# Patient Record
Sex: Male | Born: 1950
Health system: Southern US, Community
[De-identification: ages and names within clinical notes are randomized; demographics above are authoritative.]

## PROBLEM LIST (undated history)

## (undated) DIAGNOSIS — I2510 Atherosclerotic heart disease of native coronary artery without angina pectoris: Secondary | ICD-10-CM

## (undated) DIAGNOSIS — C140 Malignant neoplasm of pharynx, unspecified: Secondary | ICD-10-CM

## (undated) DIAGNOSIS — D571 Sickle-cell disease without crisis: Secondary | ICD-10-CM

## (undated) DIAGNOSIS — N400 Enlarged prostate without lower urinary tract symptoms: Secondary | ICD-10-CM

## (undated) DIAGNOSIS — H3322 Serous retinal detachment, left eye: Secondary | ICD-10-CM

## (undated) DIAGNOSIS — D696 Thrombocytopenia, unspecified: Secondary | ICD-10-CM

## (undated) DIAGNOSIS — E785 Hyperlipidemia, unspecified: Secondary | ICD-10-CM

## (undated) DIAGNOSIS — F329 Major depressive disorder, single episode, unspecified: Secondary | ICD-10-CM

## (undated) DIAGNOSIS — IMO0002 Reserved for concepts with insufficient information to code with codable children: Secondary | ICD-10-CM

## (undated) DIAGNOSIS — C801 Malignant (primary) neoplasm, unspecified: Secondary | ICD-10-CM

## (undated) DIAGNOSIS — F32A Depression, unspecified: Secondary | ICD-10-CM

## (undated) DIAGNOSIS — R569 Unspecified convulsions: Secondary | ICD-10-CM

## (undated) DIAGNOSIS — J309 Allergic rhinitis, unspecified: Secondary | ICD-10-CM

## (undated) HISTORY — DX: Serous retinal detachment, left eye: H33.22

## (undated) HISTORY — DX: Malignant (primary) neoplasm, unspecified: C80.1

## (undated) HISTORY — DX: Atherosclerotic heart disease of native coronary artery without angina pectoris: I25.10

## (undated) HISTORY — DX: Unspecified convulsions: R56.9

## (undated) HISTORY — DX: Major depressive disorder, single episode, unspecified: F32.9

## (undated) HISTORY — DX: Thrombocytopenia, unspecified: D69.6

## (undated) HISTORY — DX: Benign prostatic hyperplasia without lower urinary tract symptoms: N40.0

## (undated) HISTORY — DX: Reserved for concepts with insufficient information to code with codable children: IMO0002

## (undated) HISTORY — DX: Sickle-cell disease without crisis: D57.1

## (undated) HISTORY — DX: Depression, unspecified: F32.A

## (undated) HISTORY — PX: PILONIDAL CYST EXCISION: SHX744

## (undated) HISTORY — DX: Malignant neoplasm of pharynx, unspecified: C14.0

## (undated) HISTORY — DX: Allergic rhinitis, unspecified: J30.9

## (undated) HISTORY — DX: Hyperlipidemia, unspecified: E78.5

---

## 2003-10-10 ENCOUNTER — Inpatient Hospital Stay (HOSPITAL_COMMUNITY): Admission: EM | Admit: 2003-10-10 | Discharge: 2003-10-16 | Payer: Self-pay | Admitting: Emergency Medicine

## 2004-10-24 ENCOUNTER — Ambulatory Visit: Payer: Self-pay | Admitting: *Deleted

## 2004-11-14 ENCOUNTER — Ambulatory Visit: Payer: Self-pay | Admitting: Family Medicine

## 2005-01-20 ENCOUNTER — Ambulatory Visit: Payer: Self-pay | Admitting: Family Medicine

## 2005-04-03 ENCOUNTER — Ambulatory Visit (HOSPITAL_COMMUNITY): Admission: RE | Admit: 2005-04-03 | Discharge: 2005-04-03 | Payer: Self-pay | Admitting: Otolaryngology

## 2005-04-03 ENCOUNTER — Encounter (INDEPENDENT_AMBULATORY_CARE_PROVIDER_SITE_OTHER): Payer: Self-pay | Admitting: *Deleted

## 2005-04-18 ENCOUNTER — Ambulatory Visit (HOSPITAL_COMMUNITY): Admission: RE | Admit: 2005-04-18 | Discharge: 2005-04-18 | Payer: Self-pay | Admitting: Colon and Rectal Surgery

## 2005-04-28 ENCOUNTER — Ambulatory Visit (HOSPITAL_COMMUNITY): Admission: RE | Admit: 2005-04-28 | Discharge: 2005-04-28 | Payer: Self-pay | Admitting: Radiation Oncology

## 2005-04-28 ENCOUNTER — Ambulatory Visit: Payer: Self-pay | Admitting: Family Medicine

## 2005-04-30 ENCOUNTER — Ambulatory Visit: Payer: Self-pay | Admitting: Dentistry

## 2005-04-30 ENCOUNTER — Encounter: Admission: AD | Admit: 2005-04-30 | Discharge: 2005-04-30 | Payer: Self-pay | Admitting: Dentistry

## 2005-05-28 ENCOUNTER — Ambulatory Visit: Admission: RE | Admit: 2005-05-28 | Discharge: 2005-08-08 | Payer: Self-pay | Admitting: Radiation Oncology

## 2005-06-03 ENCOUNTER — Ambulatory Visit: Payer: Self-pay | Admitting: Dentistry

## 2005-07-02 ENCOUNTER — Ambulatory Visit: Payer: Self-pay | Admitting: Dentistry

## 2005-08-11 ENCOUNTER — Ambulatory Visit: Payer: Self-pay | Admitting: Family Medicine

## 2005-08-19 ENCOUNTER — Ambulatory Visit: Payer: Self-pay | Admitting: Dentistry

## 2005-09-08 ENCOUNTER — Ambulatory Visit: Payer: Self-pay | Admitting: Family Medicine

## 2005-09-09 ENCOUNTER — Ambulatory Visit: Payer: Self-pay | Admitting: Family Medicine

## 2005-11-19 ENCOUNTER — Ambulatory Visit: Payer: Self-pay | Admitting: Family Medicine

## 2005-12-09 ENCOUNTER — Ambulatory Visit: Payer: Self-pay | Admitting: Dentistry

## 2005-12-10 ENCOUNTER — Ambulatory Visit: Payer: Self-pay | Admitting: Family Medicine

## 2005-12-31 ENCOUNTER — Ambulatory Visit: Payer: Self-pay | Admitting: Family Medicine

## 2006-01-19 ENCOUNTER — Ambulatory Visit: Payer: Self-pay | Admitting: Dentistry

## 2006-02-10 ENCOUNTER — Ambulatory Visit: Payer: Self-pay | Admitting: Family Medicine

## 2006-02-18 ENCOUNTER — Ambulatory Visit: Payer: Self-pay | Admitting: Dentistry

## 2006-03-29 IMAGING — PT NM PET TUM IMG SKULL BASE T - THIGH
4 series · 25 of 25 positions shown · non-contrast
Comparison: Patient had a biopsy of the epiglottis on 04/03/05 with diagnosis of squamous cell carcinoma.   CT scan on 04/18/05 without evidence of cervical adenopathy.

CLINICAL DATA: History of squamous cell carcinoma of the epiglottis.
 FDG PET-CT TUMOR IMAGING (SKULL BASE TO THIGHS):
 Fasting Blood Glucose:  104.
TECHNIQUE: 17.8 mCi F-18 FDG was injected intravenously via the left antecubital fossa.  Full-ring PET imaging was performed from the skull base through the mid-thighs 62 minutes after injection.  CT data was obtained and used for attenuation correction and anatomic localization only.  (This was not acquired as a diagnostic CT examination).

[Series 1: pet ac · axial · 3.3mm · 4.69mm/px · z∈[-870,+0]mm · 8 of 267 slices shown]
[im 1/267]
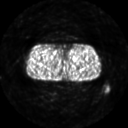
[im 39/267]
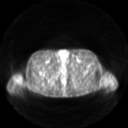
[im 77/267]
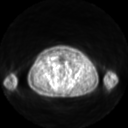
[im 115/267]
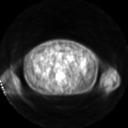
[im 153/267]
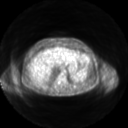
[im 191/267]
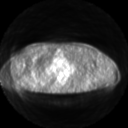
[im 229/267]
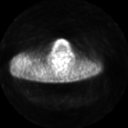
[im 267/267]
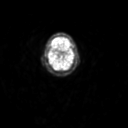

[Series 2: pet nac · axial · 3.3mm · 4.69mm/px · z∈[-870,+0]mm · 8 of 267 slices shown]
[im 1/267]
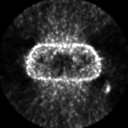
[im 39/267]
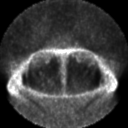
[im 77/267]
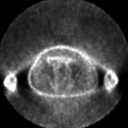
[im 115/267]
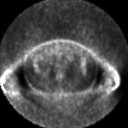
[im 153/267]
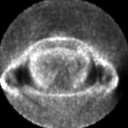
[im 191/267]
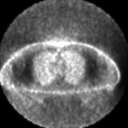
[im 229/267]
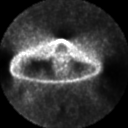
[im 267/267]
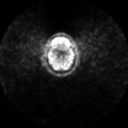

[Series 2: ct images · axial · 3.8mm · 0.98mm/px · z∈[-870,+0]mm · 8 of 267 slices shown]
[im 1/267]
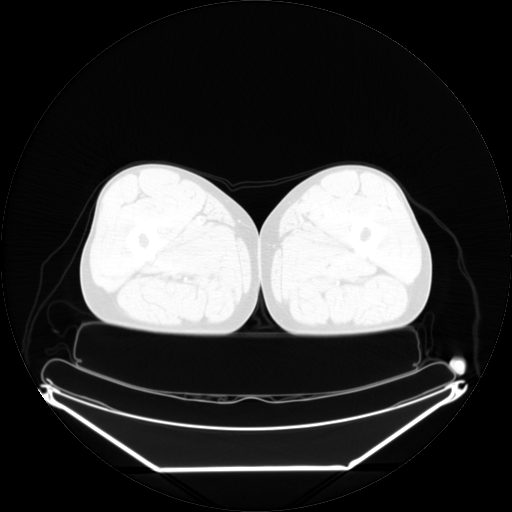
[im 39/267]
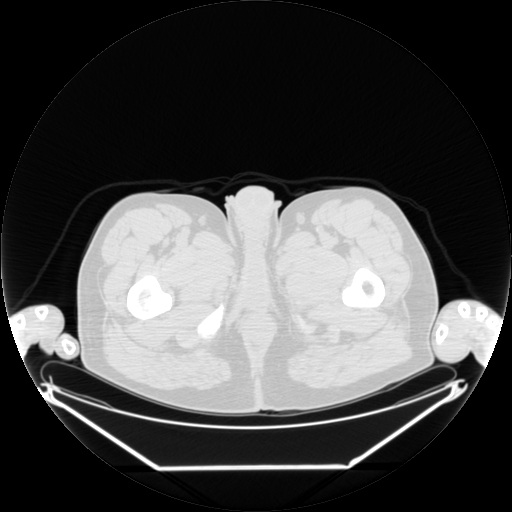
[im 77/267]
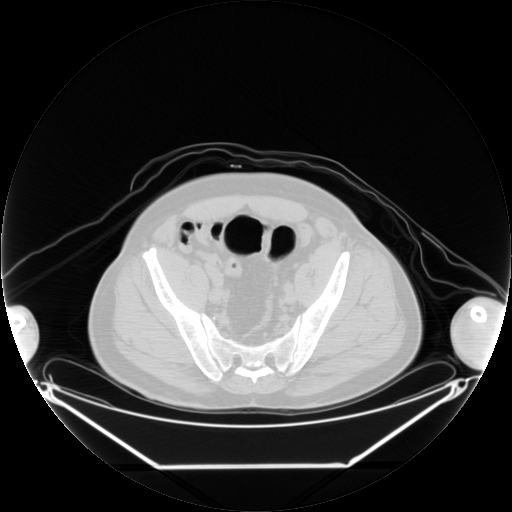
[im 115/267]
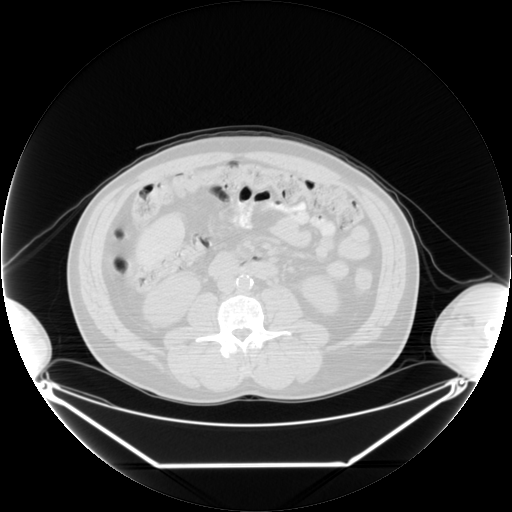
[im 153/267]
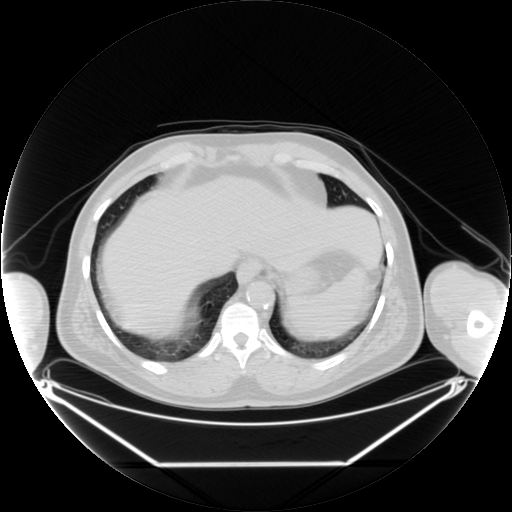
[im 191/267]
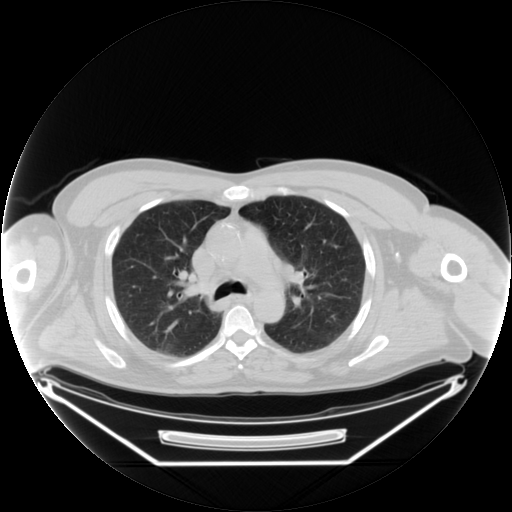
[im 229/267]
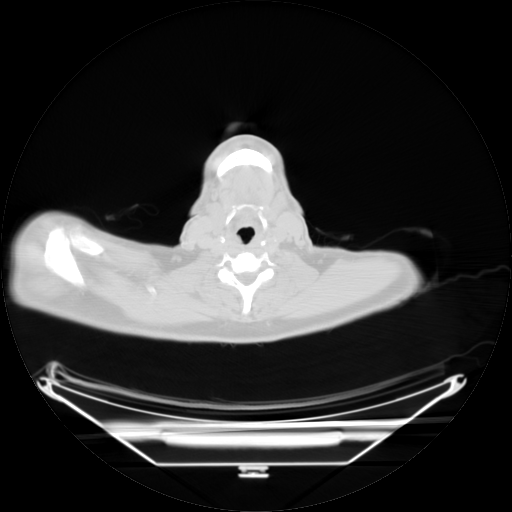
[im 267/267  brain]
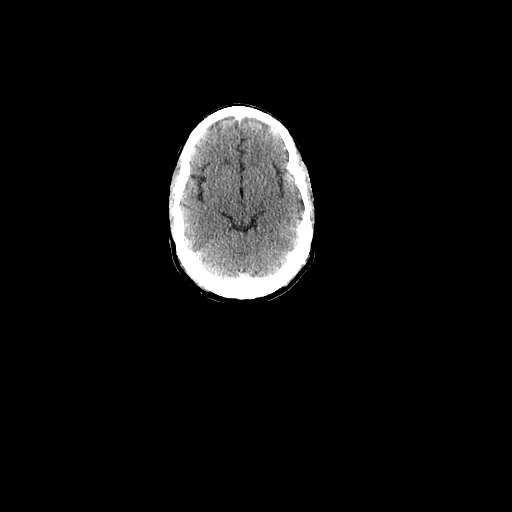

[Series 123: mip · coronal · 3.3mm · 4.69mm/px · 1 of 30 slices shown]
[im 1/30]
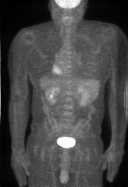

[25 of 25 positions shown; findings below may reference images not displayed]

FINDINGS: There is only mild increase in the FDG activity in the region of the pharynx, uvula and epiglottic area.  No focal area of abnormal FDG activity is visualized.   No abnormal FDG activity in the cervical nodes in the neck.
 No abnormal metabolic activity in the chest, abdomen or pelvis.
IMPRESSION: PET scan reveals no significant abnormal FDG activity in the region of the uvula or epiglottis.  There is mild diffuse activity noted in the region of the pharynx but this is thought to be a normal metabolic finding.
 Also no abnormal activity in the neck.
IMPRESSION:

## 2006-03-30 ENCOUNTER — Ambulatory Visit: Payer: Self-pay | Admitting: Dentistry

## 2006-04-09 ENCOUNTER — Ambulatory Visit: Payer: Self-pay | Admitting: Family Medicine

## 2006-04-27 ENCOUNTER — Ambulatory Visit: Payer: Self-pay | Admitting: Dentistry

## 2006-05-07 ENCOUNTER — Ambulatory Visit: Payer: Self-pay | Admitting: Family Medicine

## 2006-05-27 ENCOUNTER — Ambulatory Visit: Payer: Self-pay | Admitting: Family Medicine

## 2006-07-29 ENCOUNTER — Ambulatory Visit: Payer: Self-pay | Admitting: Dentistry

## 2006-09-03 ENCOUNTER — Ambulatory Visit: Payer: Self-pay | Admitting: Family Medicine

## 2006-09-09 ENCOUNTER — Ambulatory Visit: Payer: Self-pay | Admitting: Dentistry

## 2006-11-16 ENCOUNTER — Ambulatory Visit: Payer: Self-pay | Admitting: Family Medicine

## 2007-01-11 ENCOUNTER — Ambulatory Visit: Payer: Self-pay | Admitting: Family Medicine

## 2007-03-23 ENCOUNTER — Ambulatory Visit: Payer: Self-pay | Admitting: Dentistry

## 2007-06-24 ENCOUNTER — Ambulatory Visit: Payer: Self-pay | Admitting: Orthopedic Surgery

## 2007-10-05 ENCOUNTER — Ambulatory Visit: Payer: Self-pay | Admitting: Dentistry

## 2008-04-25 ENCOUNTER — Ambulatory Visit: Payer: Self-pay | Admitting: Dentistry

## 2010-07-31 ENCOUNTER — Ambulatory Visit (HOSPITAL_COMMUNITY): Admission: RE | Admit: 2010-07-31 | Discharge: 2010-07-31 | Payer: Self-pay | Admitting: Otolaryngology

## 2010-08-02 ENCOUNTER — Ambulatory Visit (HOSPITAL_COMMUNITY): Admission: RE | Admit: 2010-08-02 | Discharge: 2010-08-02 | Payer: Self-pay | Admitting: Otolaryngology

## 2010-08-22 ENCOUNTER — Encounter (INDEPENDENT_AMBULATORY_CARE_PROVIDER_SITE_OTHER): Payer: Self-pay | Admitting: Otolaryngology

## 2010-08-22 ENCOUNTER — Ambulatory Visit (HOSPITAL_COMMUNITY): Admission: RE | Admit: 2010-08-22 | Discharge: 2010-08-23 | Payer: Self-pay | Admitting: Otolaryngology

## 2011-01-08 LAB — BASIC METABOLIC PANEL WITH GFR
BUN: 5 mg/dL — ABNORMAL LOW (ref 6–23)
CO2: 28 meq/L (ref 19–32)
Calcium: 9.5 mg/dL (ref 8.4–10.5)
Chloride: 109 meq/L (ref 96–112)
Creatinine, Ser: 0.73 mg/dL (ref 0.4–1.5)
GFR calc non Af Amer: >60 mL/min
Glucose, Bld: 106 mg/dL — ABNORMAL HIGH (ref 70–99)
Potassium: 4.4 meq/L (ref 3.5–5.1)
Sodium: 142 meq/L (ref 135–145)

## 2011-01-08 LAB — COMPREHENSIVE METABOLIC PANEL
ALT: 17 U/L (ref 0–53)
AST: 27 U/L (ref 0–37)
Albumin: 4.7 g/dL (ref 3.5–5.2)
Alkaline Phosphatase: 51 U/L (ref 39–117)
BUN: 5 mg/dL — ABNORMAL LOW (ref 6–23)
CO2: 23 mEq/L (ref 19–32)
Calcium: 9.9 mg/dL (ref 8.4–10.5)
Chloride: 107 mEq/L (ref 96–112)
Creatinine, Ser: 0.85 mg/dL (ref 0.4–1.5)
GFR calc Af Amer: >60 mL/min (ref >60)
GFR calc non Af Amer: >60 mL/min (ref >60)
Glucose, Bld: 105 mg/dL — ABNORMAL HIGH (ref 70–99)
Potassium: 4.6 mEq/L (ref 3.5–5.1)
Sodium: 137 mEq/L (ref 135–145)
Total Bilirubin: 0.5 mg/dL (ref 0.3–1.2)
Total Protein: 8 g/dL (ref 6.0–8.3)

## 2011-01-08 LAB — DIFFERENTIAL
Basophils Absolute: 0 K/uL (ref 0.0–0.1)
Basophils Relative: 0 % (ref 0–1)
Eosinophils Absolute: 0.2 K/uL (ref 0.0–0.7)
Eosinophils Relative: 4 % (ref 0–5)
Lymphocytes Relative: 33 % (ref 12–46)
Lymphs Abs: 1.2 K/uL (ref 0.7–4.0)
Monocytes Absolute: 0.3 K/uL (ref 0.1–1.0)
Monocytes Relative: 8 % (ref 3–12)
Neutro Abs: 2 K/uL (ref 1.7–7.7)
Neutrophils Relative %: 55 % (ref 43–77)

## 2011-01-08 LAB — CBC
HCT: 32.4 % — ABNORMAL LOW (ref 39.0–52.0)
HCT: 39.4 % (ref 39.0–52.0)
Hemoglobin: 10.8 g/dL — ABNORMAL LOW (ref 13.0–17.0)
Hemoglobin: 13.3 g/dL (ref 13.0–17.0)
MCH: 28.5 pg (ref 26.0–34.0)
MCH: 29.1 pg (ref 26.0–34.0)
MCHC: 33.3 g/dL (ref 30.0–36.0)
MCHC: 33.8 g/dL (ref 30.0–36.0)
MCV: 85.5 fL (ref 78.0–100.0)
MCV: 86.2 fL (ref 78.0–100.0)
Platelets: 168 10*3/uL (ref 150–400)
Platelets: 230 K/uL (ref 150–400)
RBC: 3.79 MIL/uL — ABNORMAL LOW (ref 4.22–5.81)
RBC: 4.57 MIL/uL (ref 4.22–5.81)
RDW: 13.7 % (ref 11.5–15.5)
RDW: 13.8 % (ref 11.5–15.5)
WBC: 2.9 10*3/uL — ABNORMAL LOW (ref 4.0–10.5)
WBC: 3.7 K/uL — ABNORMAL LOW (ref 4.0–10.5)

## 2011-01-08 LAB — SURGICAL PCR SCREEN
MRSA, PCR: NEGATIVE
Staphylococcus aureus: NEGATIVE

## 2011-03-14 NOTE — Consult Note (Signed)
NAME:  Colin Mitchell, Colin Mitchell                        ACCOUNT NO.:  [id number]   MEDICAL RECORD NO.:  [id number]                   PATIENT TYPE:  INP   LOCATION:  A228                                 FACILITY:  [facility]   PHYSICIAN:  Ladona Horns. Neijstrom, MD               DATE OF BIRTH:  1951/01/25   DATE OF CONSULTATION:  10/16/2003  DATE OF DISCHARGE:  10/16/2003                                   CONSULTATION   DIAGNOSES:  1. Thrombocytopenia most likely related to folic acid deficiency.  2. Alcoholism.  3. Alcohol induced withdrawal seizures, for which she was admitted.  4. Peripheral neuropathy most likely on the basis of alcohol toxicity, plus     or minus folic acid deficiency.  5. 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  Unclear etiology.  6. History of pilonidal cyst surgery requiring a skin graft from his right     back several years ago.  7. Longstanding smoking history of approximately a pack of cigarettes a day.   [name]:  This gentleman is 60 years old.  He was  admitted with seizure through the emergency room on the above mentioned  date.  He was found to have an alcohol level of 6 and he was also found to  be thrombocytopenic with a platelet count of 65,000.  It dropped to as low  as 26,000 on 10/13/03.  It is now up to 75,000.  His MCV was actually normal  at admission.  Hemoglobin was 13.8 g, which is normal.  It dropped to 12.4  g.  His white count was normal on admission.  It dropped temporarily to 3600  with an unremarkable differential.   He was found to have a TSH level, which was normal.  He had liver enzymes,  which showed an SGOT of 123.  Recent SGPT of 47 on October 15, 2003.  His  bilirubin was 1.3 on admission.  It rose to as high as 2.2 on October 13, 2003.  His potassium was low at 2.7.  It has now been corrected to normal.  His BUN was 3, creatinine 0.8.  His B12 level was checked and was found to  863, which is normal, but his  red cell folic acid level was very low at 102  with the normal range being 180-600 nanograms/ml.  His PSA level was only  0.34.  His urinalysis showed a moderate amount of red cells in the form of  blood but microscopically only 3-6 red cells per high powered field.   He was admitted, he was stabilized, seizures were controlled, and I was  asked to see him in consultation for this persistent thrombocytopenia.  He  has not had bleeding from his nose, gums, bowels, or GU tract that he is  aware of.   He states that he has lost some weight since he took up drinking  about four  years ago but prior to that he had quit for nine years, but he has been  drinking, he states, since age 65 on a very heavy basis.  He usually drinks  about a fifth of alcohol per day, but recently decided he wanted to slow  down, so he started drinking approximately a half of a pint or less per day,  then developed this seizure.   He cannot see out of his left eye due scarring of the lens plus or minus the  cornea.   ALLERGIES:  1. PENICILLIN gives him rash.  2. SULFONAMIDES gives him a rash.  3. ASPIRIN gives him a rash.  4. IBUPROFEN given him a rash.   SOCIAL HISTORY:  He is not married.  He has three children by previous  relationships but he has been with a woman who is in the room today for the  last 23 years, she states.  He has not really held a job in some time, it  sounds like.  He does smoke as mentioned.   REVIEW OF SYSTEMS:  He denies shortness of breath but he has had numbness  and tingling develop on his feet.  He has to walk with a cane at this time.   PHYSICAL EXAMINATION:  VITAL SIGNS:  He is afebrile.  His blood pressure is  around 120/70, his respirations are 16-18 and unlabored.  SKIN:  He has no obvious skin lesions that are problematic.  He has a scar  on the right upper back from where he has had a skin graft taken.  He has no  obvious adenopathy.  HEENT:  Most of his teeth are  missing.  The ones that are left are in very  very poor repair.  The tongue, however, is slightly smooth but in the  midline.  His right pupil appears to respond a little bit but the left eye  looks like it is apparently scarred, very white inside.  ABDOMEN:  Show no hepatosplenomegaly.  EXTREMITIES:  He has no peripheral edema.  He has peripheral pulses in the  feet, which I cannot feel whatsoever.  HEART:  Shows a regular rate and rhythm.  There is a soft grade 1/6 systolic  ejection murmur.  He has no S3 gallop.  CHEST:  He has no gynecomastia.  NEUROLOGIC:  He had intact heel-to-shin testing and he had intact strength  in his hand grips.  Facial asymmetry appeared fairly intact.   IMPRESSION:  This gentleman, I think, clearly has folic acid deficiency,  which we can correct, and we have asked him to come back and see [place] in two  months.  He has given already a prescription for folic acid 1 mg a day,  which he start.  I have told he and his significant other that the foods to  eat are broccoli, brussel sprouts, lettuce greens, and asparagus to replace  him normally through diet.  Hopefully he will comply and stay away from  alcohol, which I have asked him to try to do.  We are happy to see him in  the future.      ___________________________________________                                            Ladona Horns. Mariel Sleet, MD   ESN/MEDQ  D:  10/16/2003  T:  10/16/2003  Job:  [id number]   cc:   Hanley Hays. Dechurch, M.D.  [address]  [place]  [place] [address]  Fax: [phone]

## 2011-03-14 NOTE — Discharge Summary (Signed)
NAME:  Colin Mitchell, Colin Mitchell                        ACCOUNT NO.:  [id number]   MEDICAL RECORD NO.:  [id number]                   PATIENT TYPE:  INP   LOCATION:  A228                                 FACILITY:  [facility]   PHYSICIAN:  Hanley Hays. Dechurch, M.D.           DATE OF BIRTH:  Mar 24, 1951   DATE OF ADMISSION:  DATE OF DISCHARGE:  10/16/2003                                 DISCHARGE SUMMARY   DIAGNOSES:  1. Delirium tremens.  2. Withdrawal seizures.  3. Severe thrombocytopenia improving.  4. Alcoholic hepatitis.  5. Folate deficiency with paresthesias.  6. Hypokalemia resolved.  7. 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   DISPOSITION:  The patient was discharged to home to follow up with emergency  services through mental health today.   FOLLOW UP:  As a new patient to Dr. Lysbeth Galas to follow up his  thrombocytopenia, alcoholic liver disease, and folate deficiency.   CONDITION ON DISCHARGE:  Improved.   MEDICATIONS:  1. Ativan 0.5 t.i.d.  Further taper per mental health/primary care.  2. Folate 1 mg p.o. daily.   HOSPITAL COURSE:  A 60 year old [place] American male with a history of  alcohol abuse, who apparently had been to rehabilitation and had been  abstinent for many years up until about four years ago, when he started  drinking again to the tune of a fifth or more of liquor per day.  He  presented to the hospital after he attempted to detoxify himself at home by  drinking small amounts on a periodic basis.  However, he developed seizures  and was brought to the emergency room.  The patient was admitted to the  hospital.  He failed the detoxification protocol due to breakthrough but  actually did well on an increased dose of Ativan.  He did have some delirium  but this is clear and he has returned to his baseline state.  He did  complain of leg pain and paresthesias.  RBC folate was indeed 102.  B12 was  normal as was thyroid function.  He was noted to be  thrombocytopenic with a  platelet count of 65,000.  At the time of admission it fell to a low of 26,  all nonessential medications, IE, everything but Ativan was discontinued and  his platelets were 75 at the time of discharge.  He had no evidence of  bleeding or adverse effect.  It was felt that this was a combination of drug  and chronic alcohol toxicity of his bone marrow.  He was also noted to have  elevated AST and ALT, which improved.  Hepatitis B and C studies were  negative.  The patient had returned to his baseline mental status.  He is  motivated to proceed with outpatient substance abuse counseling.  He has a  supportive significant other, who also is motivated.  He is being discharged  to home with the medications as noted above.  PHYSICAL EXAMINATION:  GENERAL:  Reveals a well-developed male who is in no  distress.  VITAL SIGNS:  Temperature is 98, blood pressure is 90/70, pulse is in the  80s and regular, O2 saturation is 99% on room air.  NECK:  Supple.  No JVD, adenopathy, or thyromegaly.  HEENT:  His conjunctivae are clear.  Pupils are reactive.  Fundi are not  well visualized.  LUNGS:  Clear to auscultation though diminished.  HEART:  Regular rate and rhythm.  No murmurs, rubs, or gallops.  ABDOMEN:  Soft, flat.  The liver edge is easily palpated but nontender.  No  masses noted.  EXTREMITIES:  Without clubbing, cyanosis, or edema.  NEUROLOGIC:  Reveals the patient to be alert and oriented.  Somewhat almost  manic and pleasant but this apparently is his baseline state.  No  hallucinations or evidence of delirium at this time.   ASSESSMENT/PLAN:  As noted above.  Follow up as noted above.     ___________________________________________                                         Hanley Hays. Josefine Class, M.D.   FED/MEDQ  D:  10/16/2003  T:  10/16/2003  Job:  [id number]   cc:   Delaney Meigs, M.D.  [address]  [place]  [place] [address]  Fax: [phone]

## 2011-03-14 NOTE — Op Note (Signed)
NAME:  Colin Mitchell, Colin Mitchell              ACCOUNT NO.:  [id number]   MEDICAL RECORD NO.:  [id number]          PATIENT TYPE:  OIB   LOCATION:  2899                         FACILITY:  [facility]   PHYSICIAN:  Jefry H. Pollyann Kennedy, MD     DATE OF BIRTH:  [birth year]   DATE OF PROCEDURE:  04/03/2005  DATE OF DISCHARGE:                                 OPERATIVE REPORT   PREOPERATIVE DIAGNOSIS:  Pharyngeal mass.   POSTOPERATIVE DIAGNOSIS:  Pharyngeal mass.   PROCEDURE:  Direct laryngoscopy with biopsy of the pharynx and  esophagoscopy.   SURGEON:  Jefry H. Pollyann Kennedy, MD   General endotracheal anesthesia was used.  No complications.   FINDINGS:  Esophagus and larynx clear.  A 1.5 cm firm, friable, ulcerative  mass involving the entire mucosal surface of the uvula.  Multiple biopsies  were taken and sent for pathologic evaluation.  No complications.  No blood  loss.   REFERRING PHYSICIAN:  Delaney Meigs, M.D.   HISTORY:  This is a 60 year old with a four-month history of sore throat.  He has a history of smoking and drinking.  In-office examination revealed a  possible uvular mass.  The risks, benefits, alternatives, and complications  of the procedure were explained to the patient, who seemed to understand and  agreed to surgery.   PROCEDURE:  The patient was taken to the operating room and placed on the  operating table in supine position.  Following induction of general  endotracheal anesthesia, the table was turned and the patient was draped in  a standard fashion.   #1, Esophagoscopy:  The rigid cervical esophagoscope was entered into the  oral cavity, through the cricopharyngeus, and into the cervical esophagus as  far as the scope would reach.  It was slowly withdrawn while suctioning  secretions, examining the circumferential mucosal wall of the esophagus.  No  lesions were identified.   #2, Direct laryngoscopy with biopsy:  The Jako and anterior commissure  laryngoscope were used to view  the hypopharyngeal, oropharyngeal and  laryngeal structures.  There was mild laryngitis present but no masses  present  within the larynx.  The pharynx was clear with the exception of the above-  mentioned findings described of the uvula.  Multiple biopsies were taken.  Topical adrenalin was used for hemostasis.  Secretions were suctioned and  the patient was then awakened, extubated and transferred to recovery in  stable condition.       JHR/MEDQ  D:  04/03/2005  T:  04/03/2005  Job:  [id number]   cc:   Delaney Meigs, M.D.  [address]  [place]  [place] [address]  Fax: [phone]

## 2011-03-14 NOTE — H&P (Signed)
NAME:  Colin Mitchell, Colin Mitchell                        ACCOUNT NO.:  [id number]   MEDICAL RECORD NO.:  [id number]                   PATIENT TYPE:  INP   LOCATION:  IC10                                 FACILITY:  [facility]   PHYSICIAN:  Hanley Hays. Dechurch, M.D.           DATE OF BIRTH:  1951-08-13   DATE OF ADMISSION:  10/10/2003  DATE OF DISCHARGE:                                HISTORY & PHYSICAL   [name]:  A 60 year old African-American male with no  primary physician who has a longstanding history of alcohol abuse.  He  drinks about a pint-and-a-half or more per day over the last four years.  He  was apparently attempting to detox himself by decreasing his amount of  liquor drinking 3 to 4 ounces every couple of hours with his last drink  being at about 0300 today.  This morning he began seizing and was brought to  the emergency room for further evaluation.  He was seen December 13 in the  [facility] emergency room secondary to falling and blackouts.  He was also  complaining of leg pain but was discharged to home with follow-up ordered  for [facility], Alcoholics Anonymous, and a primary care  Hence Colin Mitchell.  The patient states he had been through detox in the past and  actually had been abstinent for many years up until four years ago.  This is  substantiated by his significant other who has been with him for 24 years  and is present today.  His review of systems is pertinent for bilateral  lower extremity paresthesias in a stocking-glove fashion.  He states it has  been present for three or four months.  He has also developed gross painless  hematuria which is negative.  He has had weight loss - perhaps as much as 50-  60 pounds over the past two years.  He had no appetite.  He notes nausea.  He denies any bowel complaints.  He denies any pain at present with the  exception of his legs.  He has no fever or chills.  He occasionally has  chest pain which he describes  as light and has been off and on for many  years.  It has been worse recently and associated with vomiting.   PAST MEDICAL HISTORY:  Apparently the patient has been told he has sickle  cell trait.  He apparently has some eye effects and has had multiple  procedures on his eyes and is blind in his left eye.  He has a history of  alcoholism and a history of DTs.   ALLERGIES:  PENICILLIN is reported to cause a rash, ASPIRIN, SULFA, and  IBUPROFEN.  Apparently these were documented in the [facility] records.   SOCIAL HISTORY:  He is not married.  He has had a girlfriend 24 years.  Denies any children.  Smokes one pack per day.  No street drugs.  His  alcohol is noted above.   FAMILY MEDICAL HISTORY:  Noncontributory.   PHYSICAL EXAMINATION:  GENERAL:  Reveals a thin black male who is alert,  mildly tremulous, able to answer appropriately.  No hallucinations.  VITAL SIGNS:  Weight is 137, height 65.5.  Blood pressure is 148/90, pulse  is in the 70s to 90s and regular, respirations are unlabored.  HEENT:  His oropharynx was moist, no thrush.  Teeth are in poor repair.  NECK:  Supple.  No adenopathy, no bruits.  LUNGS:  Diminished but clear and __________.  HEART:  Regular.  There is a soft systolic murmur at the left sternal border  but no gallop.  ABDOMEN:  Flat, soft, and nontender.  His liver edge is felt about 4 cm  below the right costal margin.  No masses noted.  RECTAL:  Deferred.  EXTREMITIES:  Without clubbing, cyanosis; no edema.  His dorsalis pedis and  posterior tibial pulses are easily palpated and equal bilaterally.  SKIN:  There is no gross skin rash, lesion, or breakdown.  NEUROLOGIC:  Reveals him to be appropriate, alert, nonfocal.  Mild  tremulousness as noted above.   MEDICATIONS:  The patient denies.   ASSESSMENT AND PLAN:  1. Alcohol abuse with withdrawal seizure/delirium tremens.  2. History of sickle cell trait.  3. Thrombocytopenia, most likely on the basis of  alcohol toxicity.  4. Painless hematuria, new diagnosis.  It is unclear whether he was     catheterized yesterday or not at [facility].  Will check urine culture.  He     does have a low-grade fever which may be related to #1 but will     empirically begin Levaquin.  5. Hypokalemia.  Potassium is 2.7, normal renal function.  Will proceed with     IV and p.o. supplements.  6. Hypomagnesemia.  7. Weight loss, multifactorial.  8. Peripheral neuropathy by history.  Will obtain the necessary studies.  It     is most likely alcohol.   Discussed with the patient and his significant other.  Will need mental  health referral once we complete the detox.     ___________________________________________                                         Hanley Hays Josefine Class, M.D.   FED/MEDQ  D:  10/10/2003  T:  10/10/2003  Job:  [id number]

## 2011-05-05 ENCOUNTER — Ambulatory Visit (HOSPITAL_COMMUNITY): Payer: Self-pay | Admitting: Dentistry

## 2011-05-05 DIAGNOSIS — K117 Disturbances of salivary secretion: Secondary | ICD-10-CM

## 2011-05-05 DIAGNOSIS — K1370 Unspecified lesions of oral mucosa: Secondary | ICD-10-CM

## 2011-11-25 ENCOUNTER — Encounter (HOSPITAL_COMMUNITY): Payer: Self-pay | Admitting: Dentistry

## 2011-11-25 ENCOUNTER — Ambulatory Visit (HOSPITAL_COMMUNITY): Payer: Medicaid - Dental | Admitting: Dentistry

## 2011-11-25 VITALS — BP 126/81 | HR 81 | Temp 97.6°F

## 2011-11-25 DIAGNOSIS — K1370 Unspecified lesions of oral mucosa: Secondary | ICD-10-CM

## 2011-11-25 DIAGNOSIS — K117 Disturbances of salivary secretion: Secondary | ICD-10-CM

## 2011-11-25 DIAGNOSIS — Z463 Encounter for fitting and adjustment of dental prosthetic device: Secondary | ICD-10-CM

## 2011-11-25 DIAGNOSIS — K0820 Unspecified atrophy of edentulous alveolar ridge: Secondary | ICD-10-CM

## 2011-11-25 NOTE — Progress Notes (Signed)
Tuesday, November 25, 2011   BP: 126/81           P:  78              T: 97.6   Colin Mitchell presents for periodic oral exam and evaluation of upper and lower complete dentures. Medical Hx Update: No changes by report. Premedication: None Required. Allergies/ADR's: 1. Penicillin 2. Sulfa 3. ASA 4. Ibuprofen Medications: 1. Flomax 0.4mg  daily for BPH 2. Folic acid 1mg  daily  C/C: No problems ..."but I really don't eat with my dentures".   DENTAL EXAM General: Patient is a well-developed, well-nourished male in no acute distress. Extraoral exam  No lymphadenopathy.  No TMJ symptoms.   Intraoral Exam: Xerostomia consistent with previous radiation therapy. Residual palatal torus status post palatal torus reduction.                        Residual surgical defect LR retromolar pad area. No denture irritation noted. Dentition: Edentulous. Prosth: Upper and lower complete dentures are stable but less than ideal retention of both prostheses. Pressure indicating paste applied to upper and lower dentures and adjustments made as needed. [place]. We had an extensive discussion on the possible improvement of denture retention and stability with upper and lower lab Relines. Patient has agreed to proceed with the upper and lower complete denture relines. Occlusion: Lingualized occlusion is acceptable. Class II relationship as before. Plan: 1. RTC  as scheduled for reline procedures. Call if problems with dentures before then.      Pt. reminded of risk for ORN from denture irritation.   Dr. Cindra Eves

## 2011-12-03 ENCOUNTER — Ambulatory Visit (HOSPITAL_COMMUNITY): Payer: Medicaid - Dental | Admitting: Dentistry

## 2011-12-03 VITALS — BP 130/68 | HR 69 | Temp 97.9°F

## 2011-12-03 DIAGNOSIS — Z463 Encounter for fitting and adjustment of dental prosthetic device: Secondary | ICD-10-CM

## 2011-12-03 DIAGNOSIS — K08109 Complete loss of teeth, unspecified cause, unspecified class: Secondary | ICD-10-CM

## 2011-12-03 NOTE — Progress Notes (Signed)
Wednesday, December 03, 2011   BP: 130/68            P: 69          T:   97.9  Colin Mitchell presents for start of upper and lower complete denture relines. Exam: Patient is edentulous.  Discussed procedures involved in upper and lower complete denture relines and prognosis for successful procedure. Price for denture relines confirmed. Patient agrees to proceed with upper and lower complete denture relines. Procedure: Upper and lower complete denture border molding and impression with Isofunctional compound. To [facility] for lab reline procedures. Return to clinic for upper and lower complete denture reline insertion.  Dr. Cindra Eves

## 2011-12-08 ENCOUNTER — Encounter (HOSPITAL_COMMUNITY): Payer: Self-pay | Admitting: Dentistry

## 2011-12-08 ENCOUNTER — Ambulatory Visit (HOSPITAL_COMMUNITY): Payer: Medicaid - Dental | Admitting: Dentistry

## 2011-12-08 VITALS — BP 139/74 | HR 75 | Temp 98.0°F

## 2011-12-08 DIAGNOSIS — K08109 Complete loss of teeth, unspecified cause, unspecified class: Secondary | ICD-10-CM

## 2011-12-08 DIAGNOSIS — Z463 Encounter for fitting and adjustment of dental prosthetic device: Secondary | ICD-10-CM

## 2011-12-08 NOTE — Progress Notes (Signed)
Monday, December 08, 2011   BP: 139/74           P:  75        T: 98.0  Colin Mitchell presents for upper and lower complete denture reline insertion.    Pressure indicating place applied to upper and lower complete dentures and adjusted as needed . [place].  Occlusion adjusted as needed. [place]. Lingualized occlusion is noted, Postop instructions were provided in a written and verbal format on the use and care of dentures. Patient accepts results. Discussed denture irritation and risks for future areas of ORN. Patient refused to schedule an appointment for next week and will call if problems arise. RTC for denture recall in August as scheduled. Dr. Kristin Bruins

## 2012-06-09 ENCOUNTER — Encounter (HOSPITAL_COMMUNITY): Payer: Self-pay | Admitting: Dentistry

## 2012-06-09 ENCOUNTER — Ambulatory Visit (HOSPITAL_COMMUNITY): Payer: Medicaid - Dental | Admitting: Dentistry

## 2012-06-09 VITALS — BP 136/84 | HR 76 | Temp 99.1°F

## 2012-06-09 DIAGNOSIS — Z972 Presence of dental prosthetic device (complete) (partial): Secondary | ICD-10-CM

## 2012-06-09 DIAGNOSIS — K0820 Unspecified atrophy of edentulous alveolar ridge: Secondary | ICD-10-CM

## 2012-06-09 DIAGNOSIS — K1370 Unspecified lesions of oral mucosa: Secondary | ICD-10-CM

## 2012-06-09 DIAGNOSIS — Z463 Encounter for fitting and adjustment of dental prosthetic device: Secondary | ICD-10-CM

## 2012-06-09 DIAGNOSIS — K117 Disturbances of salivary secretion: Secondary | ICD-10-CM

## 2012-06-09 NOTE — Progress Notes (Signed)
06/09/2012 Patient:            Colin Mitchell Date of Birth:  1951-03-04 MRN:                [id number]  BP 136/84  Pulse 76  Temp 99.1 F (37.3 C) (Oral)  Past Medical History  Diagnosis Date  . Cancer 2006, 2011    SCCA of the Uvula; S/P radiation therapy 06/09/05 thru 07/31/05; H/O recurrence in 2011 with recurrence in 2011 and S/P resection on 08/22/10.  Marland Kitchen Benign prostatic hypertrophy   . Thrombocytopenia     H/O thrombocytopenia  . Depression     H/O depression  . Seizures     H/O seizures secondary to alcohol withdrawal  . Degenerative disc disease   . Detached retina, left     H/O Detached retina of left eye with visual deficit   Allergies  Allergen Reactions  . Sulfa Antibiotics   . Aspirin Rash  . Ibuprofen Rash  . Penicillins Rash   Current Outpatient Prescriptions  Medication Sig Dispense Refill  . clopidogrel (PLAVIX) 75 MG tablet Take 75 mg by mouth daily.      . folic acid (FOLVITE) 1 MG tablet Take 1 mg by mouth daily.      . Ranitidine HCl (RANITIDINE ACID REDUCER PO) Take by mouth 2 (two) times daily.      . Tamsulosin HCl (FLOMAX) 0.4 MG CAPS Take 0.4 mg by mouth 2 (two) times daily.          Colin Mitchell presents for periodic oral exam and evaluation of upper and lower complete dentures.  C/C: No problems.   DENTAL EXAM General: Patient is a well-developed, well-nourished male in no acute distress. Extraoral exam  No palpable lymphadenopathy.  No TMJ symptoms.   Intraoral Exam: Xerostomia consistent with previous radiation therapy. Residual palatal torus status post palatal torus reduction.                          Residual surgical defect LR retromolar pad area. No denture irritation noted.   Severe atrophy of edentulous alveolar ridges. Dentition: Edentulous.  Prosth: Upper and lower complete dentures are stable and retentive since last reline procedure in February of 2013. Pressure indicating paste applied to upper and lower dentures and  adjustments made as needed. [place]. Occlusion: Lingualized occlusion is acceptable. Class II relationship as before.  Plan: 1. RTC  as scheduled for recall in 6 months. Call if problems with dentures before then.      Pt. reminded of risk for ORN from denture irritation.   Dr. Cindra Eves

## 2013-01-03 ENCOUNTER — Ambulatory Visit (HOSPITAL_COMMUNITY): Payer: Medicaid - Dental | Admitting: Dentistry

## 2013-01-03 ENCOUNTER — Encounter (HOSPITAL_COMMUNITY): Payer: Self-pay | Admitting: Dentistry

## 2013-01-03 VITALS — BP 133/74 | HR 64 | Temp 98.1°F | Ht 66.0 in | Wt 165.0 lb

## 2013-01-03 DIAGNOSIS — K117 Disturbances of salivary secretion: Secondary | ICD-10-CM

## 2013-01-03 DIAGNOSIS — Z463 Encounter for fitting and adjustment of dental prosthetic device: Secondary | ICD-10-CM

## 2013-01-03 DIAGNOSIS — K08109 Complete loss of teeth, unspecified cause, unspecified class: Secondary | ICD-10-CM

## 2013-01-03 DIAGNOSIS — K062 Gingival and edentulous alveolar ridge lesions associated with trauma: Secondary | ICD-10-CM

## 2013-01-03 DIAGNOSIS — K1370 Unspecified lesions of oral mucosa: Secondary | ICD-10-CM

## 2013-01-03 NOTE — Patient Instructions (Addendum)
Plan: 1. Return to clinic as scheduled for recall in 6 months. Call if problems with dentures before then. Use salt water rinses as needed to aid healing.     Use multiple sips of water for dry mouth.  Patient reminded of risk for osteoradionecrosis (ORN) from denture irritation.   Charlynne Pander, DDS

## 2013-01-03 NOTE — Progress Notes (Signed)
01/03/2013  Patient:            Colin Mitchell Date of Birth:  07-05-1951 MRN:                [id number]  BP 133/74  Pulse 64  Temp(Src) 98.1 F (36.7 C) (Oral)  Ht 5\' 6"  (1.676 m)  Wt 165 lb (74.844 kg)  BMI 26.64 kg/m2   Past Medical History  Diagnosis Date  . Cancer 2006, 2011    [facility] [place]; S/P radiation therapy 06/09/05 thru 07/31/05; H/O recurrence in 2011 with recurrence in 2011 and S/P resection on 08/22/10.  Marland Kitchen Benign prostatic hypertrophy   . Thrombocytopenia     H/O thrombocytopenia  . Depression     H/O depression  . Seizures     H/O seizures secondary to alcohol withdrawal  . Degenerative disc disease   . Detached retina, left     H/O Detached retina of left eye with visual deficit   Allergies  Allergen Reactions  . Sulfa Antibiotics   . Aspirin Rash  . Ibuprofen Rash  . Penicillins Rash   Current Outpatient Prescriptions  Medication Sig Dispense Refill  . clopidogrel (PLAVIX) 75 MG tablet Take 75 mg by mouth daily.      . folic acid (FOLVITE) 1 MG tablet Take 1 mg by mouth daily.      . Ranitidine HCl (RANITIDINE ACID REDUCER PO) Take by mouth 2 (two) times daily.      . Tamsulosin HCl (FLOMAX) 0.4 MG CAPS Take 0.4 mg by mouth 2 (two) times daily.        No current facility-administered medications for this visit.     Jarid Sasso presents for periodic oral exam and evaluation of upper and lower complete dentures.  C/C: History of denture irritation.   DENTAL EXAM General: Patient is a well-developed, well-nourished male in no acute distress. Extraoral exam  No palpable lymphadenopathy.  No TMJ symptoms.   Intraoral Exam: Xerostomia consistent with previous radiation therapy. Residual palatal torus status post palatal torus reduction.                          Residual surgical defect LR retromolar pad area. No denture ulcerations are noted. Generalized erythema noted.   Severe atrophy of edentulous alveolar ridges. Dentition: Edentulous.   Prosth: Upper and lower complete dentures are stable and retentive since last reline procedure in February of 2013. Pressure indicating paste applied to upper and lower dentures and adjustments made as needed. [place]. Occlusion: Lingualized occlusion is acceptable. Adjustments were made as needed. Class II relationship as before.  Plan: 1. Return to clinic as scheduled for recall in 6 months. Call if problems with dentures before then. Use salt water rinses as needed to aid healing.     Use multiple sips of water for dry mouth.  Patient reminded of risk for osteoradionecrosis (ORN) from denture irritation.   Charlynne Pander, DDS

## 2013-07-19 ENCOUNTER — Encounter (HOSPITAL_COMMUNITY): Payer: Self-pay | Admitting: Dentistry

## 2013-07-26 ENCOUNTER — Encounter (HOSPITAL_COMMUNITY): Payer: Medicaid - Dental | Admitting: Dentistry

## 2013-08-01 ENCOUNTER — Encounter (HOSPITAL_COMMUNITY): Payer: Self-pay | Admitting: Dentistry

## 2013-08-01 ENCOUNTER — Ambulatory Visit (HOSPITAL_COMMUNITY): Payer: Medicaid - Dental | Admitting: Dentistry

## 2013-08-01 VITALS — BP 146/77 | HR 67 | Temp 98.4°F

## 2013-08-01 DIAGNOSIS — K117 Disturbances of salivary secretion: Secondary | ICD-10-CM

## 2013-08-01 DIAGNOSIS — K0820 Unspecified atrophy of edentulous alveolar ridge: Secondary | ICD-10-CM

## 2013-08-01 DIAGNOSIS — K08109 Complete loss of teeth, unspecified cause, unspecified class: Secondary | ICD-10-CM

## 2013-08-01 DIAGNOSIS — Z463 Encounter for fitting and adjustment of dental prosthetic device: Secondary | ICD-10-CM

## 2013-08-01 NOTE — Progress Notes (Signed)
08/01/2013  Patient:            SILER MAVIS Date of Birth:  February 12, 1951 MRN:                [id number]  BP 146/77  Pulse 67  Temp(Src) 98.4 F (36.9 C) (Oral)   Past Medical History  Diagnosis Date  . Cancer 2006, 2011    [facility] [place]; S/P radiation therapy 06/09/05 thru 07/31/05; H/O recurrence in 2011 and S/P resection on 08/22/10.  Marland Kitchen Benign prostatic hypertrophy   . Thrombocytopenia     H/O thrombocytopenia  . Depression     H/O depression  . Seizures     H/O seizures secondary to alcohol withdrawal  . Degenerative disc disease   . Detached retina, left     H/O Detached retina of left eye with visual deficit   Allergies  Allergen Reactions  . Sulfa Antibiotics   . Aspirin Rash  . Ibuprofen Rash  . Penicillins Rash   Current Outpatient Prescriptions  Medication Sig Dispense Refill  . clopidogrel (PLAVIX) 75 MG tablet Take 75 mg by mouth daily.      . folic acid (FOLVITE) 1 MG tablet Take 1 mg by mouth daily.      . Ranitidine HCl (RANITIDINE ACID REDUCER PO) Take by mouth 2 (two) times daily.      . Tamsulosin HCl (FLOMAX) 0.4 MG CAPS Take 0.4 mg by mouth 2 (two) times daily.        No current facility-administered medications for this visit.     Eldwin Volkov presents for periodic oral exam and evaluation of upper and lower complete dentures.  C/C: "No problems with my dentures "  DENTAL EXAM General: Patient is a well-developed, well-nourished male in no acute distress. Extraoral exam  No palpable lymphadenopathy.  No TMJ symptoms.   Intraoral Exam: Xerostomia consistent with previous radiation therapy. Residual palatal torus status post palatal torus reduction.                          Residual surgical defect LR retromolar pad area. No denture ulcerations are noted. Generalized erythema noted.    Severe atrophy of edentulous alveolar ridges. Dentition: Edentulous.  Prosth: Upper and lower complete dentures are stable and retentive since last reline  procedure in February of 2013. Pressure indicating paste applied to upper and lower dentures and adjustments made as needed. [place]. Occlusion: Lingualized occlusion is acceptable. No adjustments were needed. Class II relationship as before.  Plan: 1. Return to clinic as scheduled for recall in 12 months. Call if problems with dentures before then.      Use salt water rinses or Biotene rinse as needed to aid healing.  Use multiple sips of water for dry mouth.       Patient reminded of risk for osteoradionecrosis (ORN) from denture irritation.   Charlynne Pander, DDS

## 2013-08-01 NOTE — Patient Instructions (Addendum)
Plan: 1. Return to clinic as scheduled for recall in 12 months. Call if problems with dentures before then.      Use salt water rinses or Biotene rinse as needed to aid healing.  Use multiple sips of water for dry mouth.       Patient reminded of risk for osteoradionecrosis (ORN) from denture irritation.   Charlynne Pander, DDS    Instructions for Denture Use and Care  Congratulations, you are on the way to oral rehabilitation!  You have just received a new set of complete or partial dentures.  These prostheses will help to improve both your appearance and chewing ability.  These instructions will help you get adjusted to your dentures as well as care for them properly.  Please read these instructions carefully and completely as soon as you get home.  If you or your caregiver have any questions please notify the [facility] at [phone].  HOW YOUR DENTURES LOOK AND FEEL Soon after you begin wearing your dentures, you may feel that your dentures are too large or even loose.  As our mouth and facial muscles become accustomed to the dentures, these feelings will go away.  You also may feel that you are salivating more than you normally do.  This feeling should go away as you get used to having the dentures in your mouth.  You may bite your cheek or your tongue; this will eventually resolve itself as you wear your dentures.  Some soreness is to be expected, but you should not hurt.  If your mouth hurts, call your dentist.  A denture adhesive may occasionally be necessary to hold your dentures in place more securely.  The dentist will let you know when one is recommended for you.  SPEAKING Wearing dentures will change the sound of your voice initially.  This will be noticed by you more than anyone else.  Bite and swallow before you speak, in order to place your dentures in position so that you may speak more clearly.  Practice speaking by reading aloud or counting from 1 to 100 very  slowly and distinctly.  After some practice your mouth will become accustomed to your dentures and you will speak more clearly.  EATING Chewing will definitely be different after you receive your dentures.  With a little practice and patience you should be able to eat just about any kind of food.  Begin by eating small quantities of food that are cut into small pieces.  Star with soft foods such as eggs, cooked vegetables, or puddings.  As you gain confidence advance  Your diet to whatever texture foods you can tolerate.  DENTURE CARE Dentures can collect plaque and calculus much the same as natural teeth can.  If not removed on a regular basis, your dentures will not look or feel clean, and you will experience denture odor.  It is very important that you remove your dentures at bedtime and clean them thoroughly.  You should: 1. Clean your dentures over a sink full of water so if dropped, breakage will be prevented. 2. Rinse your dentures with cool water to remove any large food particles. 3. Use soap and water or a denture cleanser or paste to clean the dentures.  Do not use regular toothpaste as it may abrade the denture base or teeth. 4. Use a moistened denture brush to clean all surfaces (inside and outside). 5. Rinse thoroughly to remove any remaining soap or denture cleanser. 6. Use  a soft bristle toothbrush to gently brush any natural teeth, gums, tongue, and palate at bedtime and before reinserting your dentures. 7. Do not sleep with your dentures in your mouth at night.  Remove your dentures and soak them overnight in a denture cup filled with water or denture solution as recommended by your dentist.  This routine will become second nature and will increase the life and comfort of your dentures.  Please do not try to adjust these dentures yourself; you could damage them.  FOLLOW-UP You should call or make an appointment with your dentist.  Your dentist would like to see you at least once a  year for a check-up and examination.

## 2013-10-27 HISTORY — PX: THROAT SURGERY: SHX803

## 2014-08-01 ENCOUNTER — Encounter (HOSPITAL_COMMUNITY): Payer: Self-pay | Admitting: Dentistry

## 2015-01-16 DIAGNOSIS — R531 Weakness: Secondary | ICD-10-CM | POA: Insufficient documentation

## 2015-01-16 DIAGNOSIS — M5440 Lumbago with sciatica, unspecified side: Secondary | ICD-10-CM | POA: Insufficient documentation

## 2015-01-16 DIAGNOSIS — H5440 Blindness, one eye, unspecified eye: Secondary | ICD-10-CM | POA: Insufficient documentation

## 2015-01-16 DIAGNOSIS — R599 Enlarged lymph nodes, unspecified: Secondary | ICD-10-CM | POA: Insufficient documentation

## 2015-01-16 DIAGNOSIS — Z872 Personal history of diseases of the skin and subcutaneous tissue: Secondary | ICD-10-CM | POA: Insufficient documentation

## 2015-01-16 DIAGNOSIS — R591 Generalized enlarged lymph nodes: Secondary | ICD-10-CM | POA: Insufficient documentation

## 2015-07-31 ENCOUNTER — Encounter (HOSPITAL_COMMUNITY): Payer: Self-pay | Admitting: Dentistry

## 2015-08-14 ENCOUNTER — Encounter (HOSPITAL_COMMUNITY): Payer: Self-pay | Admitting: Dentistry

## 2015-08-14 ENCOUNTER — Ambulatory Visit (HOSPITAL_COMMUNITY): Payer: Medicaid - Dental | Admitting: Dentistry

## 2015-08-14 VITALS — BP 155/66 | HR 82 | Temp 98.2°F

## 2015-08-14 DIAGNOSIS — Z923 Personal history of irradiation: Secondary | ICD-10-CM

## 2015-08-14 DIAGNOSIS — K0820 Unspecified atrophy of edentulous alveolar ridge: Secondary | ICD-10-CM

## 2015-08-14 DIAGNOSIS — C051 Malignant neoplasm of soft palate: Secondary | ICD-10-CM

## 2015-08-14 DIAGNOSIS — K08109 Complete loss of teeth, unspecified cause, unspecified class: Secondary | ICD-10-CM

## 2015-08-14 DIAGNOSIS — K117 Disturbances of salivary secretion: Secondary | ICD-10-CM

## 2015-08-14 DIAGNOSIS — R682 Dry mouth, unspecified: Secondary | ICD-10-CM

## 2015-08-14 NOTE — Patient Instructions (Signed)
Return to clinic as scheduled for recall in 6 months. Call if problems with dentures arise before then.  Use salt water rinses or Biotene rinse as needed to aid healing.  Use multiple sips of water for dry mouth.   Patient reminded of risk for osteoradionecrosis (ORN) from denture irritation.   [name], DDS

## 2015-08-14 NOTE — Progress Notes (Signed)
08/14/2015  Patient:            Colin Mitchell Date of Birth:  04-28-1951 MRN:                [id number]  BP 155/66 mmHg  Pulse 82  Temp(Src) 98.2 F (36.8 C) (Oral)   Past Medical History  Diagnosis Date  . Cancer ([place]) 2006, 2011    [facility] of the [place]; S/P radiation therapy 06/09/05 thru 07/31/05; H/O recurrence in 2011 and S/P resection on 08/22/10.  Marland Kitchen Benign prostatic hypertrophy   . Thrombocytopenia ([place])     H/O thrombocytopenia  . Depression     H/O depression  . Seizures ([place])     H/O seizures secondary to alcohol withdrawal  . Degenerative disc disease   . Detached retina, left     H/O Detached retina of left eye with visual deficit   Allergies  Allergen Reactions  . Sulfa Antibiotics   . Aspirin Rash  . Ibuprofen Rash  . Penicillins Rash   Current Outpatient Prescriptions  Medication Sig Dispense Refill  . clopidogrel (PLAVIX) 75 MG tablet Take 75 mg by mouth daily.    . folic acid (FOLVITE) 1 MG tablet Take 1 mg by mouth daily.    . Ranitidine HCl (RANITIDINE ACID REDUCER PO) Take by mouth 2 (two) times daily.    . Tamsulosin HCl (FLOMAX) 0.4 MG CAPS Take 0.4 mg by mouth 2 (two) times daily.      No current facility-administered medications for this visit.     Colin Mitchell presents for periodic oral exam and evaluation of upper and lower complete dentures. The patient was seen in July 2006 for a preradiation therapy dental protocol examination. Patient was then referred to Dr. Frederik Schmidt for extractions with alveoloplasty and pre-prosthetic surgery. Patient then underwent radiation therapy from 06/09/2005 through 07/31/2005 with Dr. Valere Dross. Upper and lower complete dentures were then fabricated and inserted on 02/16/2006. Upper and lower complete dentures were relined and inserted on 09/09/2006. Upper and lower complete dentures were then relined and inserted on 12/08/2011. Patient was last seen in October 2014 for denture recall. Patient now presents  for oral examination and evaluation of upper lower complete dentures.  C/C: Patient is complaining of some denture irritation to the upper and lower denture extension. Patient is currently complaining of back pain and is being evaluated for possible back surgery.  DENTAL EXAM General: Patient is a well-developed, well-nourished male in no acute distress. Extraoral exam:  No palpable lymphadenopathy.  No TMJ symptoms.   Intraoral Exam: Xerostomia consistent with previous radiation therapy. Residual palatal torus status post palatal torus reduction.                          Residual surgical defect LR retromolar pad area. No denture ulcerations are noted. Generalized erythema noted.    Severe atrophy of edentulous alveolar ridges. Dentition: Edentulous.  Prosth: Upper and lower complete dentures are stable and retentive since last reline procedure in February of 2013. Pressure indicating paste applied to upper and lower dentures and adjustments made as needed. Significant adjustments made to the lower denture. [place] Occlusion: Lingualized occlusion is noted. Adjustments were made of the lingualized occlusion to stabilize bite bilaterally Class II relationship as before.  Plan: 1. We discussed the benefits of new upper lower complete dentures, new upper lower complete denture relines, referral to a prosthodontist for evaluation for new dentures with or without implants.  The patient currently refuses all these options. Return to clinic as scheduled for recall in 6 months. Call if problems with dentures arise before then.      Use salt water rinses or Biotene rinse as needed to aid healing.  Use multiple sips of water for dry mouth.       Patient reminded of risk for osteoradionecrosis (ORN) from denture irritation. 2. Start smoking cessation treatment as scheduled.  [name], DDS

## 2016-02-26 ENCOUNTER — Encounter (HOSPITAL_COMMUNITY): Payer: Self-pay | Admitting: Dentistry

## 2016-03-03 ENCOUNTER — Encounter (HOSPITAL_COMMUNITY): Payer: Self-pay | Admitting: Dentistry

## 2016-04-22 DIAGNOSIS — Z85819 Personal history of malignant neoplasm of unspecified site of lip, oral cavity, and pharynx: Secondary | ICD-10-CM | POA: Insufficient documentation

## 2016-08-11 ENCOUNTER — Ambulatory Visit (HOSPITAL_COMMUNITY): Payer: Medicaid - Dental | Admitting: Dentistry

## 2016-08-11 ENCOUNTER — Encounter (INDEPENDENT_AMBULATORY_CARE_PROVIDER_SITE_OTHER): Payer: Self-pay

## 2016-08-11 ENCOUNTER — Encounter (HOSPITAL_COMMUNITY): Payer: Self-pay | Admitting: Dentistry

## 2016-08-11 VITALS — BP 130/54 | HR 66 | Temp 98.5°F

## 2016-08-11 DIAGNOSIS — R682 Dry mouth, unspecified: Secondary | ICD-10-CM

## 2016-08-11 DIAGNOSIS — K117 Disturbances of salivary secretion: Secondary | ICD-10-CM | POA: Diagnosis not present

## 2016-08-11 DIAGNOSIS — K0820 Unspecified atrophy of edentulous alveolar ridge: Secondary | ICD-10-CM

## 2016-08-11 DIAGNOSIS — C051 Malignant neoplasm of soft palate: Secondary | ICD-10-CM

## 2016-08-11 DIAGNOSIS — K08109 Complete loss of teeth, unspecified cause, unspecified class: Secondary | ICD-10-CM | POA: Diagnosis not present

## 2016-08-11 DIAGNOSIS — Z463 Encounter for fitting and adjustment of dental prosthetic device: Secondary | ICD-10-CM

## 2016-08-11 DIAGNOSIS — Z923 Personal history of irradiation: Secondary | ICD-10-CM

## 2016-08-11 NOTE — Progress Notes (Signed)
08/11/2016  Patient:            Colin Mitchell Date of Birth:  [birth year] MRN:                [id number]  BP (!) 130/54 (BP Location: Left Arm)   Pulse 66   Temp 98.5 F (36.9 C) (Oral)    Past Medical History:  Diagnosis Date  . Benign prostatic hypertrophy   . Cancer ([place]) 2006, 2011   [facility] the [place]; S/P radiation therapy 06/09/05 thru 07/31/05; H/O recurrence in 2011 and S/P resection on 08/22/10.  . Degenerative disc disease   . Depression    H/O depression  . Detached retina, left    H/O Detached retina of left eye with visual deficit  . Seizures ([place])    H/O seizures secondary to alcohol withdrawal  . Thrombocytopenia ([employer])    H/O thrombocytopenia   Allergies  Allergen Reactions  . Sulfa Antibiotics   . Aspirin Rash  . Ibuprofen Rash  . Penicillins Rash   Current Outpatient Prescriptions  Medication Sig Dispense Refill  . clopidogrel (PLAVIX) 75 MG tablet Take 75 mg by mouth daily.    . folic acid (FOLVITE) 1 MG tablet Take 1 mg by mouth daily.    . Ranitidine HCl (RANITIDINE ACID REDUCER PO) Take by mouth 2 (two) times daily.    . Tamsulosin HCl (FLOMAX) 0.4 MG CAPS Take 0.4 mg by mouth 2 (two) times daily.      No current facility-administered medications for this visit.      [place] presents for periodic oral exam and evaluation of upper and lower complete dentures. The patient was seen in July 2006 for a preradiation therapy dental protocol examination. Patient was then referred to Dr. Frederik Schmidt for extractions with alveoloplasty and pre-prosthetic surgery. Patient then underwent radiation therapy from 06/09/2005 through 07/31/2005 with Dr. Valere Dross. Upper and lower complete dentures were then fabricated and inserted on 02/16/2006. Upper and lower complete dentures were relined and inserted on 09/09/2006. Upper and lower complete dentures were then again relined and inserted on 12/08/2011. Patient was last seen in October 2016 for denture  recall. Patient now presents for oral examination and evaluation of upper and lower complete dentures.  C/C: Patient is complaining of a history of denture irritation to the lower denture.  DENTAL EXAM General: Patient is a well-developed, well-nourished male in no acute distress. Extraoral exam:  No palpable lymphadenopathy.  No TMJ symptoms.   Intraoral Exam: Xerostomia consistent with previous radiation therapy. Residual palatal torus status post palatal torus reduction. Residual surgical defect LR retromolar pad area. No denture ulcerations are noted. Generalized erythema noted consistent with xerostomia and continued smoking of cigarettes. Severe atrophy of edentulous alveolar ridges. Dentition: Edentulous.  Prosth: Upper and lower complete dentures are stable and retentive since last reline procedure in February of 2013. Pressure indicating paste applied to upper and lower dentures and adjustments made as needed. Significant adjustments made to the lower denture. [place] Occlusion: Lingualized occlusion is noted. Adjustments were made of the lingualized occlusion to stabilize bite bilaterally Class II relationship as before. Patient accepts results of adjustment.  Plan: 1. We discussed the benefits of new upper lower complete dentures, new upper lower complete denture relines, referral to a prosthodontist for evaluation for new dentures with or without implants. The patient currently refuses all these options. Return to clinic as scheduled for recall in 12 months by patient request. Call if problems with dentures arise before  then.  Use salt water rinses or Biotene rinse as needed to aid healing.  Use multiple sips of water for dry mouth.  Patient reminded of risk for osteoradionecrosis (ORN) from denture irritation. 2. Consider smoking cessation treatment as scheduled.  [name], DDS

## 2016-08-11 NOTE — Patient Instructions (Signed)
Plan: 1. We discussed the benefits of new upper lower complete dentures, new upper lower complete denture relines, referral to a prosthodontist for evaluation for new dentures with or without implants. The patient currently refuses all these options. Return to clinic as scheduled for recall in 12 months by patient request. Call if problems with dentures arise before then.  Use salt water rinses or Biotene rinse as needed to aid healing.  Use multiple sips of water for dry mouth.  Patient reminded of risk for osteoradionecrosis (ORN) from denture irritation. 2. Consider smoking cessation treatment as scheduled.  [name], DDS

## 2017-08-13 DIAGNOSIS — E785 Hyperlipidemia, unspecified: Secondary | ICD-10-CM | POA: Insufficient documentation

## 2017-08-31 ENCOUNTER — Encounter (HOSPITAL_COMMUNITY): Payer: Self-pay | Admitting: Dentistry

## 2018-11-09 ENCOUNTER — Ambulatory Visit (HOSPITAL_COMMUNITY): Payer: Medicaid - Dental | Admitting: Dentistry

## 2018-11-09 ENCOUNTER — Encounter (HOSPITAL_COMMUNITY): Payer: Self-pay | Admitting: Dentistry

## 2018-11-09 VITALS — BP 149/77 | HR 77 | Temp 97.9°F

## 2018-11-09 DIAGNOSIS — R682 Dry mouth, unspecified: Secondary | ICD-10-CM

## 2018-11-09 DIAGNOSIS — C051 Malignant neoplasm of soft palate: Secondary | ICD-10-CM

## 2018-11-09 DIAGNOSIS — K08109 Complete loss of teeth, unspecified cause, unspecified class: Secondary | ICD-10-CM

## 2018-11-09 DIAGNOSIS — K0820 Unspecified atrophy of edentulous alveolar ridge: Secondary | ICD-10-CM

## 2018-11-09 DIAGNOSIS — Z972 Presence of dental prosthetic device (complete) (partial): Secondary | ICD-10-CM

## 2018-11-09 DIAGNOSIS — Z463 Encounter for fitting and adjustment of dental prosthetic device: Secondary | ICD-10-CM | POA: Diagnosis not present

## 2018-11-09 DIAGNOSIS — Z923 Personal history of irradiation: Secondary | ICD-10-CM

## 2018-11-09 DIAGNOSIS — K117 Disturbances of salivary secretion: Secondary | ICD-10-CM

## 2018-11-09 NOTE — Patient Instructions (Signed)
1. Call dental clinic if acute problems arise with denture irritation 2. Keep dentures out as needed. 3. Use salt water rinses as needed to aid healing. Return to clinic for yearly denture recall or earlier if problems arise. Dr. Enrique Sack

## 2018-11-09 NOTE — Progress Notes (Signed)
11/09/2018  Patient:            Colin Mitchell Date of Birth:  06-28-1951 MRN:                [id number]  BP (!) 149/77 (BP Location: Left Arm)   Pulse 77   Temp 97.9 F (36.6 C)    Past Medical History:  Diagnosis Date  . Allergic rhinitis   . Benign prostatic hypertrophy   . CAD (coronary artery disease)   . Cancer ([place]) 2006, 2011   [facility] [place]; S/P radiation therapy 06/09/05 thru 07/31/05; H/O recurrence in 2011 and S/P resection on 08/22/10.  . Degenerative disc disease   . Depression    H/O depression  . Detached retina, left    H/O Detached retina of left eye with visual deficit  . HLD (hyperlipidemia)   . Seizures ([place])    H/O seizures secondary to alcohol withdrawal  . Thrombocytopenia ([place])    H/O thrombocytopenia    Allergies  Allergen Reactions  . Sulfa Antibiotics   . Aspirin Rash  . Ibuprofen Rash  . Penicillins Rash   Current Outpatient Medications  Medication Sig Dispense Refill  . azelastine (ASTELIN) 0.1 % nasal spray Place 2 sprays into both nostrils 2 (two) times daily. Use in each nostril as directed    . clopidogrel (PLAVIX) 75 MG tablet Take 75 mg by mouth daily.    . folic acid (FOLVITE) 1 MG tablet Take 1 mg by mouth daily.    . pravastatin (PRAVACHOL) 40 MG tablet Take 40 mg by mouth daily.    . Ranitidine HCl (RANITIDINE ACID REDUCER PO) Take by mouth 2 (two) times daily.    . Tamsulosin HCl (FLOMAX) 0.4 MG CAPS Take 0.4 mg by mouth 2 (two) times daily.      No current facility-administered medications for this visit.       Colin Mitchell presents for periodic oral exam and evaluation of upper and lower complete dentures. The patient was seen in July 2006 for a preradiation therapy dental protocol examination. Patient was then referred to Dr. Frederik Schmidt for extraction of remaining teeth with alveoloplasty and pre-prosthetic surgery. Patient then underwent radiation therapy from 06/09/2005 through 07/31/2005 with Dr. Valere Dross. Upper  and lower complete dentures were then fabricated and inserted on 02/16/2006. Upper and lower complete dentures were relined and inserted on 09/09/2006. Upper and lower complete dentures were then again relined and inserted on 12/08/2011. Patient was last seen in October 2017 for denture recall. Patient now presents for oral examination and evaluation of upper and lower complete dentures.  C/C: Patient with no significant complaints about dentures.  DENTAL EXAM General: Patient is a well-developed, well-nourished male in no acute distress. Patient walks with the aid of a cane. Extraoral exam:  No palpable lymphadenopathy.  No TMJ symptoms.   Intraoral Exam: Xerostomia consistent with previous radiation therapy. Residual palatal torus status post palatal torus reduction. Residual surgical defect LR retromolar pad area. No denture ulcerations are noted. Generalized erythema noted consistent with xerostomia and continued smoking of cigarettes. Severe atrophy of edentulous alveolar ridges. Dentition: Edentulous.  Prosth: Upper complete denture is stable and retentive. Lower complete denture is stable with less than ideal retention consistent with severe atrophy. Pressure indicating paste applied to upper and lower dentures and significant adjustments were made to the upper complete and lower complete dentures. [place] Occlusion: Lingualized occlusion is noted. Adjustments were made of the lingualized occlusion to stabilize bite bilaterally  Class II relationship as before. Patient accepts results of adjustment.  Plan: 1. We discussed proceeding with fabrication of a new upper and lower complete dentures with a dentist of his choice. Several names of dentists that take Medicaid were given to the patient if he wishes to proceed with fabrication of new dentures. Patient is aware that I no longer make new dentures.  Patient is aware that ideally he needs evaluation and treatment by an oral and maxillofacial  prosthodontist who specializes in prosthodontics rehabilitation.  The patient currently refuses all these options. The patient is to return to clinic as scheduled for recall in 12 months by patient request. Patient is to call if problems with dentures arise before then. Patient is to use salt water rinses as needed to aid healing.  Patient is to use multiple sips of water for dry mouth.  Patient reminded of risk for osteoradionecrosis (ORN) from denture irritation.    Colin Mitchell, DDS

## 2019-05-17 ENCOUNTER — Telehealth: Payer: Self-pay | Admitting: Family Medicine

## 2019-05-17 NOTE — Telephone Encounter (Signed)
Called number in patient's chart - states it is the wrong number. Patient can not get a refill on a medication until they are established in our office.

## 2019-05-23 ENCOUNTER — Other Ambulatory Visit: Payer: Self-pay

## 2019-05-24 ENCOUNTER — Encounter: Payer: Self-pay | Admitting: Physician Assistant

## 2019-05-24 ENCOUNTER — Ambulatory Visit (INDEPENDENT_AMBULATORY_CARE_PROVIDER_SITE_OTHER): Payer: Medicare Other | Admitting: Physician Assistant

## 2019-05-24 ENCOUNTER — Other Ambulatory Visit: Payer: Self-pay

## 2019-05-24 VITALS — BP 98/75 | HR 76 | Temp 98.9°F | Ht 66.0 in | Wt 161.4 lb

## 2019-05-24 DIAGNOSIS — N400 Enlarged prostate without lower urinary tract symptoms: Secondary | ICD-10-CM | POA: Diagnosis not present

## 2019-05-24 DIAGNOSIS — M150 Primary generalized (osteo)arthritis: Secondary | ICD-10-CM

## 2019-05-24 DIAGNOSIS — C140 Malignant neoplasm of pharynx, unspecified: Secondary | ICD-10-CM

## 2019-05-24 DIAGNOSIS — E782 Mixed hyperlipidemia: Secondary | ICD-10-CM | POA: Diagnosis not present

## 2019-05-24 DIAGNOSIS — D571 Sickle-cell disease without crisis: Secondary | ICD-10-CM

## 2019-05-24 DIAGNOSIS — M159 Polyosteoarthritis, unspecified: Secondary | ICD-10-CM

## 2019-05-24 MED ORDER — PRAVASTATIN SODIUM 40 MG PO TABS: 40.0000 mg | ORAL_TABLET | Freq: Every day | ORAL | 1 refills | Status: DC

## 2019-05-24 MED ORDER — TAMSULOSIN HCL 0.4 MG PO CAPS: 0.4000 mg | ORAL_CAPSULE | Freq: Two times a day (BID) | ORAL | 1 refills | Status: DC

## 2019-05-24 MED ORDER — CLOPIDOGREL BISULFATE 75 MG PO TABS: 75.0000 mg | ORAL_TABLET | Freq: Every day | ORAL | 1 refills | Status: DC

## 2019-05-24 NOTE — Progress Notes (Signed)
  Subjective:     Patient ID: Colin Mitchell, male   DOB: 05-Jun-1951, 68 y.o.   MRN: [id number]  HPI Pt here as a new pt Prev pt of Dr Edrick Oh with regular OV q 4 months Sig hx of throat Ca, BPH, Hypercholest, Sickle Cell with loss of vison to the L eye, and OA of the knees  Review of Systems  Constitutional: Negative.   HENT:       Loss of vision of the L eye but no changes  Eyes: Negative.   Respiratory: Negative.   Cardiovascular: Negative.   Genitourinary: Negative for difficulty urinating.       Nocturia x 2  Musculoskeletal: Positive for arthralgias and gait problem.       Uses cane for ambulation  Psychiatric/Behavioral: Negative.        Objective:   Physical Exam Vitals signs and nursing note reviewed.  Constitutional:      Appearance: Normal appearance.  HENT:     Nose: Nose normal.     Mouth/Throat:     Mouth: Mucous membranes are moist.     Comments: No dentation Eyes:     Comments: Clouding to both cornea with conj erythema Sl L deviation of the L eye  Neck:     Musculoskeletal: Normal range of motion and neck supple.     Vascular: No carotid bruit.  Cardiovascular:     Rate and Rhythm: Normal rate and regular rhythm.     Pulses: Normal pulses.     Heart sounds: Normal heart sounds.  Pulmonary:     Effort: Pulmonary effort is normal.     Breath sounds: Normal breath sounds.  Musculoskeletal: Normal range of motion.     Right lower leg: No edema.     Left lower leg: No edema.  Lymphadenopathy:     Cervical: No cervical adenopathy.  Skin:    General: Skin is warm and dry.  Neurological:     Mental Status: He is alert.  Psychiatric:        Mood and Affect: Mood normal.        Behavior: Behavior normal.        Assessment:     BPH Hyperlipidemia Sickle cell with with loss ofd vision to the L eye OA of the knees Nict dependence Allergic rhinitis Throat Cancer    Plan:     Continue with yearly Opth appt Continue with regular Onc  appt Good diet reviewed Pt limited with exercise due to knee pain and cane use Continue with current meds - rf to pharmacy Hydrate Keep f/u appt here in Oct

## 2019-06-07 ENCOUNTER — Other Ambulatory Visit: Payer: Self-pay

## 2019-06-07 ENCOUNTER — Ambulatory Visit (INDEPENDENT_AMBULATORY_CARE_PROVIDER_SITE_OTHER): Payer: Medicare Other | Admitting: *Deleted

## 2019-06-07 ENCOUNTER — Encounter: Payer: Self-pay | Admitting: *Deleted

## 2019-06-07 DIAGNOSIS — Z0000 Encounter for general adult medical examination without abnormal findings: Secondary | ICD-10-CM | POA: Diagnosis not present

## 2019-06-07 NOTE — Patient Instructions (Signed)
Preventive Care 68 Years and Older, Male Preventive care refers to lifestyle choices and visits with your health care provider that can promote health and wellness. This includes:  A yearly physical exam. This is also called an annual well check.  Regular dental and eye exams.  Immunizations.  Screening for certain conditions.  Healthy lifestyle choices, such as diet and exercise. What can I expect for my preventive care visit? Physical exam Your health care provider will check:  Height and weight. These may be used to calculate body mass index (BMI), which is a measurement that tells if you are at a healthy weight.  Heart rate and blood pressure.  Your skin for abnormal spots. Counseling Your health care provider may ask you questions about:  Alcohol, tobacco, and drug use.  Emotional well-being.  Home and relationship well-being.  Sexual activity.  Eating habits.  History of falls.  Memory and ability to understand (cognition).  Work and work Statistician. What immunizations do I need?  Influenza (flu) vaccine  This is recommended every year. Tetanus, diphtheria, and pertussis (Tdap) vaccine  You may need a Td booster every 10 years. Varicella (chickenpox) vaccine  You may need this vaccine if you have not already been vaccinated. Zoster (shingles) vaccine  You may need this after age 50. Pneumococcal conjugate (PCV13) vaccine  One dose is recommended after age 24. Pneumococcal polysaccharide (PPSV23) vaccine  One dose is recommended after age 33. Measles, mumps, and rubella (MMR) vaccine  You may need at least one dose of MMR if you were born in 1957 or later. You may also need a second dose. Meningococcal conjugate (MenACWY) vaccine  You may need this if you have certain conditions. Hepatitis A vaccine  You may need this if you have certain conditions or if you travel or work in places where you may be exposed to hepatitis A. Hepatitis B vaccine   You may need this if you have certain conditions or if you travel or work in places where you may be exposed to hepatitis B. Haemophilus influenzae type b (Hib) vaccine  You may need this if you have certain conditions. You may receive vaccines as individual doses or as more than one vaccine together in one shot (combination vaccines). Talk with your health care provider about the risks and benefits of combination vaccines. What tests do I need? Blood tests  Lipid and cholesterol levels. These may be checked every 5 years, or more frequently depending on your overall health.  Hepatitis C test.  Hepatitis B test. Screening  Lung cancer screening. You may have this screening every year starting at age 68 if you have a 30-pack-year history of smoking and currently smoke or have quit within the past 15 years.  Colorectal cancer screening. All adults should have this screening starting at age 68 and continuing until age 54. Your health care provider may recommend screening at age 47 if you are at increased risk. You will have tests every 1-10 years, depending on your results and the type of screening test.  Prostate cancer screening. Recommendations will vary depending on your family history and other risks.  Diabetes screening. This is done by checking your blood sugar (glucose) after you have not eaten for a while (fasting). You may have this done every 1-3 years.  Abdominal aortic aneurysm (AAA) screening. You may need this if you are a current or former smoker.  Sexually transmitted disease (STD) testing. Follow these instructions at home: Eating and drinking  Eat  a diet that includes fresh fruits and vegetables, whole grains, lean protein, and low-fat dairy products. Limit your intake of foods with high amounts of sugar, saturated fats, and salt.  Take vitamin and mineral supplements as recommended by your health care provider.  Do not drink alcohol if your health care provider  tells you not to drink.  If you drink alcohol: ? Limit how much you have to 0-2 drinks a day. ? Be aware of how much alcohol is in your drink. In the [place] one drink equals one 12 oz bottle of beer (355 mL), one 5 oz glass of wine (148 mL), or one 1 oz glass of hard liquor (44 mL). Lifestyle  Take daily care of your teeth and gums.  Stay active. Exercise for at least 30 minutes on 5 or more days each week.  Do not use any products that contain nicotine or tobacco, such as cigarettes, e-cigarettes, and chewing tobacco. If you need help quitting, ask your health care provider.  If you are sexually active, practice safe sex. Use a condom or other form of protection to prevent STIs (sexually transmitted infections).  Talk with your health care provider about taking a low-dose aspirin or statin. What's next?  Visit your health care provider once a year for a well check visit.  Ask your health care provider how often you should have your eyes and teeth checked.  Stay up to date on all vaccines. This information is not intended to replace advice given to you by your health care provider. Make sure you discuss any questions you have with your health care provider. Document Released: 11/09/2015 Document Revised: 10/07/2018 Document Reviewed: 10/07/2018 Elsevier Patient Education  2020 [employer]  

## 2019-06-07 NOTE — Progress Notes (Addendum)
MEDICARE ANNUAL WELLNESS VISIT  06/07/2019  Telephone Visit Disclaimer This Medicare AWV was conducted by telephone due to national recommendations for restrictions regarding the COVID-19 Pandemic (e.g. social distancing).  I verified, using two identifiers, that I am speaking with Colin Mitchell or their authorized healthcare agent. I discussed the limitations, risks, security, and privacy concerns of performing an evaluation and management service by telephone and the potential availability of an in-person appointment in the future. The patient expressed understanding and agreed to proceed.   Subjective:  Colin Mitchell is a 68 y.o. male patient of Dettinger, Fransisca Kaufmann, MD who had a Medicare Annual Wellness Visit today via telephone. Jowell is Retired and lives with an adult companion. he has 1 child. he reports that he is socially active and does interact with friends/family regularly. he is minimally physically active and enjoys doing yard work, gardening and listening to music.  Patient Care Team: Dettinger, Fransisca Kaufmann, MD as PCP - General (Family Medicine)  Advanced Directives 06/07/2019  Does Patient Have a Medical Advance Directive? No  Would patient like information on creating a medical advance directive? No - Patient declined    Hospital Utilization Over the Past 12 Months: # of hospitalizations or ER visits: 0 # of surgeries: 0  Review of Systems    Patient reports that his overall health is unchanged compared to last year.  Patient Reported Readings (BP, Pulse, CBG, Weight, etc) none  Review of Systems: No complaints  All other systems negative.  Pain Assessment Pain : No/denies pain     Current Medications & Allergies (verified) Allergies as of 06/07/2019      Reactions   Sulfa Antibiotics    Aspirin Rash   Ibuprofen Rash   Penicillins Rash      Medication List       Accurate as of June 07, 2019 10:10 AM. If you have any questions, ask your nurse  or doctor.        azelastine 0.1 % nasal spray Commonly known as: ASTELIN Place 2 sprays into both nostrils 2 (two) times daily. Use in each nostril as directed   clopidogrel 75 MG tablet Commonly known as: PLAVIX Take 1 tablet (75 mg total) by mouth daily.   pravastatin 40 MG tablet Commonly known as: PRAVACHOL Take 1 tablet (40 mg total) by mouth daily.   tamsulosin 0.4 MG Caps capsule Commonly known as: FLOMAX Take 1 capsule (0.4 mg total) by mouth 2 (two) times daily.       History (reviewed): Past Medical History:  Diagnosis Date  . Allergic rhinitis   . Benign prostatic hypertrophy   . CAD (coronary artery disease)   . Cancer ([place]) 2006, 2011   SCCA of the [place]; S/P radiation therapy 06/09/05 thru 07/31/05; H/O recurrence in 2011 and S/P resection on 08/22/10.  . Degenerative disc disease   . Depression    H/O depression  . Detached retina, left    H/O Detached retina of left eye with visual deficit  . HLD (hyperlipidemia)   . Seizures ([place])    H/O seizures secondary to alcohol withdrawal  . Sickle cell anemia ([facility])    only in his eyes  . Throat cancer ([place])   . Thrombocytopenia ([place])    H/O thrombocytopenia   Past Surgical History:  Procedure Laterality Date  . PILONIDAL CYST EXCISION  remote  . THROAT SURGERY  2015   Family History  Problem Relation Age of Onset  . Cancer  Mother        breast   . Diabetes Mother   . Hypertension Mother   . Cancer Father   . Cancer Brother        kidney   Social History   Socioeconomic History  . Marital status: Divorced    Spouse name: Not on file  . Number of children: 1  . Years of education: Not on file  . Highest education level: GED or equivalent  Occupational History  . Occupation: retired  Scientific laboratory technician  . Financial resource strain: Not hard at all  . Food insecurity    Worry: Never true    Inability: Never true  . Transportation needs    Medical: No    Non-medical: No  Tobacco Use  . Smoking  status: Current Every Day Smoker    Packs/day: 1.00    Years: 50.00    Pack years: 50.00    Types: Cigarettes  . Smokeless tobacco: Never Used  Substance and Sexual Activity  . Alcohol use: Not Currently    Comment: hasn't had a drink in 13 years  . Drug use: Never  . Sexual activity: Not Currently  Lifestyle  . Physical activity    Days per week: 0 days    Minutes per session: 0 min  . Stress: Not at all  Relationships  . Social Herbalist on phone: Twice a week    Gets together: Twice a week    Attends religious service: Never    Active member of club or organization: No    Attends meetings of clubs or organizations: Never    Relationship status: Divorced  Other Topics Concern  . Not on file  Social History Narrative  . Not on file    Activities of Daily Living In your present state of health, do you have any difficulty performing the following activities: 06/07/2019  Hearing? N  Vision? 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 N  Walking or climbing stairs? Y  Comment has to use his cane  Dressing or bathing? N  Doing errands, shopping? Y  Comment his friend usually takes him to all appointments and to run errands  Preparing Food and eating ? N  Using the Toilet? N  In the past six months, have you accidently leaked urine? Y  Comment not often  Do you have problems with loss of bowel control? N  Managing your Medications? N  Managing your Finances? Y  Comment his friend Otilio Connors all of his finances  Housekeeping or managing your Housekeeping? Y  Comment his friend does all the housework  Some recent data might be hidden    Patient Education/ Literacy How often do you need to have someone help you when you read instructions, pamphlets, or other written materials from your doctor or pharmacy?: 1 - Never What is the last grade level you completed in school?: GED  Exercise Current Exercise Habits: The patient does not  participate in regular exercise at present, Exercise limited by: orthopedic condition(s);Other - see comments(blind in left eye)  Diet Patient reports consuming 2 meals a day and 0 snack(s) a day Patient reports that his primary diet is: Regular Patient reports that she does have regular access to food.   Depression Screen PHQ 2/9 Scores 06/07/2019 05/24/2019  PHQ - 2 Score 0 0     Fall Risk Fall Risk  06/07/2019 05/24/2019  Falls in the past year? 0 0  Objective:  Colin Mitchell seemed alert and oriented and he participated appropriately during our telephone visit.  Blood Pressure Weight BMI  BP Readings from Last 3 Encounters:  05/24/19 98/75  11/09/18 (!) 149/77  08/11/16 (!) 130/54   Wt Readings from Last 3 Encounters:  05/24/19 161 lb 6.4 oz (73.2 kg)  01/03/13 165 lb (74.8 kg)   BMI Readings from Last 1 Encounters:  05/24/19 26.05 kg/m    *Unable to obtain current vital signs, weight, and BMI due to telephone visit type  Hearing/Vision  . Panagiotis did not seem to have difficulty with hearing/understanding during the telephone conversation . Reports that he has not had a formal eye exam by an eye care professional within the past year . Reports that he has not had a formal hearing evaluation within the past year *Unable to fully assess hearing and vision during telephone visit type  Cognitive Function: 6CIT Screen 06/07/2019  What Year? 0 points  What month? 0 points  What time? 0 points  Count back from 20 0 points  Months in reverse 0 points  Repeat phrase 0 points  Total Score 0   (Normal:0-7, Significant for Dysfunction: >8)  Normal Cognitive Function Screening: Yes   Immunization & Health Maintenance Record Immunization History  Administered Date(s) Administered  . Pneumococcal Conjugate-13 03/15/2018    Health Maintenance  Topic Date Due  . Hepatitis C Screening  1951-04-18  . TETANUS/TDAP  08/04/1970  . COLONOSCOPY  08/04/2001  . PNA vac Low  Risk Adult (2 of 2 - PPSV23) 03/16/2019  . INFLUENZA VACCINE  05/28/2019       Assessment  This is a routine wellness examination for [employer]  Health Maintenance: Due or Overdue Health Maintenance Due  Topic Date Due  . Hepatitis C Screening  20-Jul-1951  . TETANUS/TDAP  08/04/1970  . COLONOSCOPY  08/04/2001  . PNA vac Low Risk Adult (2 of 2 - PPSV23) 03/16/2019  . INFLUENZA VACCINE  05/28/2019    Colin Mitchell does not need a referral for Community Assistance: Care Management:   no Social Work:    no Prescription Assistance:  no Nutrition/Diabetes Education:  no   Plan:  Personalized Goals Goals Addressed            This Visit's Progress   . DIET - INCREASE WATER INTAKE       Try to drink 6-8 glasses of water daily.      Personalized Health Maintenance & Screening Recommendations  Pneumococcal vaccine  Influenza vaccine Td vaccine Colorectal cancer screening Shingles vaccine  Lung Cancer Screening Recommended: yes but pt will discuss at his visit with PCP (Low Dose CT Chest recommended if Age 34-80 years, 30 pack-year currently smoking OR have quit w/in past 15 years) Hepatitis C Screening recommended: yes HIV Screening recommended: no  Advanced Directives: Written information was not prepared per patient's request.  Referrals & Orders No orders of the defined types were placed in this encounter.   Follow-up Plan . Follow-up with Dettinger, Fransisca Kaufmann, MD as planned . Schedule your Colonoscopy with GI as discussed. . Pt declined all recommended vaccines   I have personally reviewed and noted the following in the patient's chart:   . Medical and social history . Use of alcohol, tobacco or illicit drugs  . Current medications and supplements . Functional ability and status . Nutritional status . Physical activity . Advanced directives . List of other physicians . Hospitalizations, surgeries, and ER visits in  previous 12 months . Vitals  . Screenings to include cognitive, depression, and falls . Referrals and appointments  In addition, I have reviewed and discussed with Colin Mitchell certain preventive protocols, quality metrics, and best practice recommendations. A written personalized care plan for preventive services as well as general preventive health recommendations is available and can be mailed to the patient at his request.      Marylin Crosby, LPN  [id number]/50/[id number]    I have reviewed and agree with the above AWV documentation.   Evelina Dun, FNP

## 2019-06-09 ENCOUNTER — Ambulatory Visit: Payer: Self-pay | Admitting: Family Medicine

## 2019-08-11 ENCOUNTER — Ambulatory Visit: Payer: Self-pay | Admitting: Family Medicine

## 2019-08-23 ENCOUNTER — Other Ambulatory Visit: Payer: Self-pay

## 2019-08-24 ENCOUNTER — Encounter: Payer: Self-pay | Admitting: Family Medicine

## 2019-08-24 ENCOUNTER — Ambulatory Visit (INDEPENDENT_AMBULATORY_CARE_PROVIDER_SITE_OTHER): Payer: Medicare Other | Admitting: Family Medicine

## 2019-08-24 VITALS — BP 114/79 | HR 88 | Temp 98.0°F | Ht 66.0 in | Wt 169.0 lb

## 2019-08-24 DIAGNOSIS — G8929 Other chronic pain: Secondary | ICD-10-CM

## 2019-08-24 DIAGNOSIS — E782 Mixed hyperlipidemia: Secondary | ICD-10-CM

## 2019-08-24 DIAGNOSIS — J3089 Other allergic rhinitis: Secondary | ICD-10-CM

## 2019-08-24 DIAGNOSIS — R55 Syncope and collapse: Secondary | ICD-10-CM

## 2019-08-24 DIAGNOSIS — R2689 Other abnormalities of gait and mobility: Secondary | ICD-10-CM

## 2019-08-24 DIAGNOSIS — N400 Enlarged prostate without lower urinary tract symptoms: Secondary | ICD-10-CM

## 2019-08-24 DIAGNOSIS — M5441 Lumbago with sciatica, right side: Secondary | ICD-10-CM | POA: Diagnosis not present

## 2019-08-24 MED ORDER — AZELASTINE HCL 0.1 % NA SOLN
2.0000 | Freq: Two times a day (BID) | NASAL | 2 refills | Status: DC
Start: 2019-08-24 — End: 2021-01-17

## 2019-08-24 MED ORDER — FLUTICASONE PROPIONATE 50 MCG/ACT NA SUSP
1.0000 | Freq: Two times a day (BID) | NASAL | 6 refills | Status: DC | PRN
Start: 2019-08-24 — End: 2021-01-17

## 2019-08-24 NOTE — Progress Notes (Signed)
BP 114/79   Pulse 88   Temp 98 F (36.7 C) (Temporal)   Ht '5\' 6"'  (1.676 m)   Wt 169 lb (76.7 kg)   SpO2 100%   BMI 27.28 kg/m    Subjective:   Patient ID: Colin Mitchell, male    DOB: 1951/06/17, 68 y.o.   MRN: [id number]  HPI: Colin Mitchell is a 68 y.o. male presenting on 08/24/2019 for Hyperlipidemia (3 month follow up) and Dizziness (spells- on and off x 1-2 months. States last week he had a dizzy spell and he passed out for a few seconds )   HPI Hyperlipidemia Patient is coming in for recheck of his hyperlipidemia. The patient is currently taking pravastatin. They deny any issues with myalgias or history of liver damage from it. They deny any focal numbness or weakness or chest pain.   Chronic sinus allergies Has chronic allergies and uses nasal spray and Astelin and wants a refill of these.  He says this is something that pops up seasonally and he would like a refill of these.  Chronic bilateral low back pain with weakness in both of his lower legs and has also had some dizziness and feeling off balance.  This has been increasing and his wife especially is concerned about his risk for falling.  Had some dizzy spells and feeling off balance over the past couple months as well.  Although he describes it more as an off-balance then lightheadedness.  Relevant past medical, surgical, family and social history reviewed and updated as indicated. Interim medical history since our last visit reviewed. Allergies and medications reviewed and updated.  Review of Systems  Constitutional: Negative for chills and fever.  HENT: Negative for congestion.   Eyes: Negative for discharge.  Respiratory: Negative for cough, shortness of breath and wheezing.   Cardiovascular: Negative for chest pain and leg swelling.  Musculoskeletal: Positive for back pain and gait problem. Negative for neck stiffness.  Skin: Negative for rash.  Neurological: Positive for weakness. Negative for  light-headedness.  All other systems reviewed and are negative.   Per HPI unless specifically indicated above   Allergies as of 08/24/2019      Reactions   Sulfa Antibiotics    Aspirin Rash   Ibuprofen Rash   Penicillins Rash      Medication List       Accurate as of August 24, 2019 11:59 PM. If you have any questions, ask your nurse or doctor.        azelastine 0.1 % nasal spray Commonly known as: ASTELIN Place 2 sprays into both nostrils 2 (two) times daily. Use in each nostril as directed   clopidogrel 75 MG tablet Commonly known as: PLAVIX Take 1 tablet (75 mg total) by mouth daily.   fluticasone 50 MCG/ACT nasal spray Commonly known as: FLONASE Place 1 spray into both nostrils 2 (two) times daily as needed for allergies or rhinitis. Started by: Fransisca Kaufmann Dettinger, MD   pravastatin 40 MG tablet Commonly known as: PRAVACHOL Take 1 tablet (40 mg total) by mouth daily.   tamsulosin 0.4 MG Caps capsule Commonly known as: FLOMAX Take 1 capsule (0.4 mg total) by mouth 2 (two) times daily.        Objective:   BP 114/79   Pulse 88   Temp 98 F (36.7 C) (Temporal)   Ht '5\' 6"'  (1.676 m)   Wt 169 lb (76.7 kg)   SpO2 100%   BMI 27.28 kg/m  Wt Readings from Last 3 Encounters:  08/24/19 169 lb (76.7 kg)  05/24/19 161 lb 6.4 oz (73.2 kg)  01/03/13 165 lb (74.8 kg)    Physical Exam Vitals signs and nursing note reviewed.  Constitutional:      General: He is not in acute distress.    Appearance: He is well-developed. He is not diaphoretic.  Neck:     Thyroid: No thyromegaly.  Cardiovascular:     Rate and Rhythm: Normal rate and regular rhythm.     Heart sounds: Normal heart sounds. No murmur.  Pulmonary:     Effort: Pulmonary effort is normal. No respiratory distress.     Breath sounds: Normal breath sounds. No wheezing.  Neurological:     Mental Status: He is alert and oriented to person, place, and time.     Motor: Weakness (4 out of 5 lower  extremity weakness) present.     Coordination: Coordination normal.     Gait: Gait abnormal.  Psychiatric:        Behavior: Behavior normal.       Assessment & Plan:   Problem List Items Addressed This Visit      Nervous and Auditory   Low back pain with sciatica   Relevant Orders   Ambulatory referral to Physical Therapy   CBC with Differential/Platelet (Completed)     Genitourinary   BPH (benign prostatic hyperplasia)   Relevant Orders   PSA, total and free (Completed)     Other   Hyperlipemia - Primary   Relevant Orders   CBC with Differential/Platelet (Completed)   CMP14+EGFR (Completed)   Lipid panel (Completed)    Other Visit Diagnoses    Balance disorder       Relevant Orders   Ambulatory referral to Physical Therapy   CBC with Differential/Platelet (Completed)   Near syncope       Relevant Orders   CBC with Differential/Platelet (Completed)   TSH (Completed)   Non-seasonal allergic rhinitis due to other allergic trigger       Relevant Medications   fluticasone (FLONASE) 50 MCG/ACT nasal spray    Will do physical therapy referral  Follow up plan: Return in about 3 months (around 11/24/2019), or if symptoms worsen or fail to improve.  Counseling provided for all of the vaccine components Orders Placed This Encounter  Procedures  . CBC with Differential/Platelet  . CMP14+EGFR  . Lipid panel  . TSH  . PSA, total and free  . Ambulatory referral to Physical Therapy    Caryl Pina, MD [facility] 08/30/2019, 1:18 PM

## 2019-08-25 LAB — CBC WITH DIFFERENTIAL/PLATELET
Basophils Absolute: 0 10*3/uL (ref 0.0–0.2)
Basos: 1 %
EOS (ABSOLUTE): 0.1 10*3/uL (ref 0.0–0.4)
Eos: 3 %
Hematocrit: 47.3 % (ref 37.5–51.0)
Hemoglobin: 15.8 g/dL (ref 13.0–17.7)
Immature Grans (Abs): 0 10*3/uL (ref 0.0–0.1)
Immature Granulocytes: 0 %
Lymphocytes Absolute: 1.1 10*3/uL (ref 0.7–3.1)
Lymphs: 35 %
MCH: 26.2 pg — ABNORMAL LOW (ref 26.6–33.0)
MCHC: 33.4 g/dL (ref 31.5–35.7)
MCV: 78 fL — ABNORMAL LOW (ref 79–97)
Monocytes Absolute: 0.3 10*3/uL (ref 0.1–0.9)
Monocytes: 8 %
Neutrophils Absolute: 1.7 10*3/uL (ref 1.4–7.0)
Neutrophils: 53 %
Platelets: 175 10*3/uL (ref 150–450)
RBC: 6.03 x10E6/uL — ABNORMAL HIGH (ref 4.14–5.80)
RDW: 15.6 % — ABNORMAL HIGH (ref 11.6–15.4)
WBC: 3.1 10*3/uL — ABNORMAL LOW (ref 3.4–10.8)

## 2019-08-25 LAB — PSA, TOTAL AND FREE
PSA, Free Pct: 43.3 %
PSA, Free: 0.26 ng/mL
Prostate Specific Ag, Serum: 0.6 ng/mL (ref 0.0–4.0)

## 2019-08-25 LAB — CMP14+EGFR
ALT: 11 IU/L (ref 0–44)
AST: 17 IU/L (ref 0–40)
Albumin/Globulin Ratio: 1.6 (ref 1.2–2.2)
Albumin: 4.9 g/dL — ABNORMAL HIGH (ref 3.8–4.8)
Alkaline Phosphatase: 75 IU/L (ref 39–117)
BUN/Creatinine Ratio: 8 — ABNORMAL LOW (ref 10–24)
BUN: 8 mg/dL (ref 8–27)
Bilirubin Total: 0.4 mg/dL (ref 0.0–1.2)
CO2: 23 mmol/L (ref 20–29)
Calcium: 10.1 mg/dL (ref 8.6–10.2)
Chloride: 99 mmol/L (ref 96–106)
Creatinine, Ser: 1.02 mg/dL (ref 0.76–1.27)
GFR calc Af Amer: 87 mL/min/{1.73_m2} (ref >59)
GFR calc non Af Amer: 75 mL/min/{1.73_m2} (ref >59)
Globulin, Total: 3.1 g/dL (ref 1.5–4.5)
Glucose: 99 mg/dL (ref 65–99)
Potassium: 4.7 mmol/L (ref 3.5–5.2)
Sodium: 138 mmol/L (ref 134–144)
Total Protein: 8 g/dL (ref 6.0–8.5)

## 2019-08-25 LAB — LIPID PANEL
Chol/HDL Ratio: 5.2 ratio — ABNORMAL HIGH (ref 0.0–5.0)
Cholesterol, Total: 197 mg/dL (ref 100–199)
HDL: 38 mg/dL — ABNORMAL LOW (ref >39)
LDL Chol Calc (NIH): 140 mg/dL — ABNORMAL HIGH (ref 0–99)
Triglycerides: 105 mg/dL (ref 0–149)
VLDL Cholesterol Cal: 19 mg/dL (ref 5–40)

## 2019-08-25 LAB — TSH: TSH: 2.91 u[IU]/mL (ref 0.450–4.500)

## 2019-08-30 ENCOUNTER — Ambulatory Visit: Payer: Medicare Other | Admitting: Physical Therapy

## 2019-11-15 ENCOUNTER — Other Ambulatory Visit: Payer: Self-pay | Admitting: Physician Assistant

## 2019-11-15 ENCOUNTER — Encounter (HOSPITAL_COMMUNITY): Payer: Self-pay | Admitting: Dentistry

## 2019-11-28 ENCOUNTER — Telehealth: Payer: Self-pay | Admitting: Family Medicine

## 2019-11-28 NOTE — Telephone Encounter (Signed)
Please get an appointment for patient to discuss blood sugars and diabetes as soon as possible

## 2019-11-28 NOTE — Telephone Encounter (Signed)
Aware and appt made.

## 2019-12-13 ENCOUNTER — Other Ambulatory Visit: Payer: Self-pay

## 2019-12-14 ENCOUNTER — Ambulatory Visit: Payer: Medicare Other | Admitting: Family Medicine

## 2020-01-31 ENCOUNTER — Other Ambulatory Visit: Payer: Self-pay

## 2020-01-31 DIAGNOSIS — D571 Sickle-cell disease without crisis: Secondary | ICD-10-CM

## 2020-01-31 DIAGNOSIS — E782 Mixed hyperlipidemia: Secondary | ICD-10-CM

## 2020-02-10 ENCOUNTER — Other Ambulatory Visit: Payer: Self-pay | Admitting: Family Medicine

## 2020-02-14 ENCOUNTER — Encounter (HOSPITAL_COMMUNITY): Payer: Medicare Other | Admitting: Dentistry

## 2020-02-15 ENCOUNTER — Other Ambulatory Visit: Payer: Medicare Other

## 2020-02-15 ENCOUNTER — Other Ambulatory Visit: Payer: Self-pay

## 2020-02-15 DIAGNOSIS — D571 Sickle-cell disease without crisis: Secondary | ICD-10-CM

## 2020-02-15 DIAGNOSIS — E782 Mixed hyperlipidemia: Secondary | ICD-10-CM | POA: Diagnosis not present

## 2020-02-15 DIAGNOSIS — R799 Abnormal finding of blood chemistry, unspecified: Secondary | ICD-10-CM | POA: Diagnosis not present

## 2020-02-15 LAB — BAYER DCA HB A1C WAIVED: HB A1C (BAYER DCA - WAIVED): 6.6 % (ref <7.0)

## 2020-02-15 LAB — LIPID PANEL

## 2020-02-16 LAB — CMP14+EGFR
ALT: 14 IU/L (ref 0–44)
AST: 20 IU/L (ref 0–40)
Albumin/Globulin Ratio: 1.5 (ref 1.2–2.2)
Albumin: 4.6 g/dL (ref 3.8–4.8)
Alkaline Phosphatase: 60 IU/L (ref 39–117)
BUN/Creatinine Ratio: 11 (ref 10–24)
BUN: 11 mg/dL (ref 8–27)
Bilirubin Total: 0.4 mg/dL (ref 0.0–1.2)
CO2: 24 mmol/L (ref 20–29)
Calcium: 9.4 mg/dL (ref 8.6–10.2)
Chloride: 99 mmol/L (ref 96–106)
Creatinine, Ser: 0.98 mg/dL (ref 0.76–1.27)
GFR calc Af Amer: 91 mL/min/{1.73_m2} (ref >59)
GFR calc non Af Amer: 79 mL/min/{1.73_m2} (ref >59)
Globulin, Total: 3.1 g/dL (ref 1.5–4.5)
Glucose: 99 mg/dL (ref 65–99)
Potassium: 4.1 mmol/L (ref 3.5–5.2)
Sodium: 137 mmol/L (ref 134–144)
Total Protein: 7.7 g/dL (ref 6.0–8.5)

## 2020-02-16 LAB — CBC WITH DIFFERENTIAL/PLATELET
Basophils Absolute: 0 10*3/uL (ref 0.0–0.2)
Basos: 1 %
EOS (ABSOLUTE): 0.2 10*3/uL (ref 0.0–0.4)
Eos: 5 %
Hematocrit: 44.1 % (ref 37.5–51.0)
Hemoglobin: 14.5 g/dL (ref 13.0–17.7)
Immature Grans (Abs): 0 10*3/uL (ref 0.0–0.1)
Immature Granulocytes: 0 %
Lymphocytes Absolute: 1.2 10*3/uL (ref 0.7–3.1)
Lymphs: 38 %
MCH: 26.3 pg — ABNORMAL LOW (ref 26.6–33.0)
MCHC: 32.9 g/dL (ref 31.5–35.7)
MCV: 80 fL (ref 79–97)
Monocytes Absolute: 0.5 10*3/uL (ref 0.1–0.9)
Monocytes: 16 %
Neutrophils Absolute: 1.3 10*3/uL — ABNORMAL LOW (ref 1.4–7.0)
Neutrophils: 40 %
Platelets: 183 10*3/uL (ref 150–450)
RBC: 5.52 x10E6/uL (ref 4.14–5.80)
RDW: 14.7 % (ref 11.6–15.4)
WBC: 3.2 10*3/uL — ABNORMAL LOW (ref 3.4–10.8)

## 2020-02-16 LAB — LIPID PANEL
Chol/HDL Ratio: 5.2 ratio — ABNORMAL HIGH (ref 0.0–5.0)
Cholesterol, Total: 172 mg/dL (ref 100–199)
HDL: 33 mg/dL — ABNORMAL LOW (ref >39)
LDL Chol Calc (NIH): 118 mg/dL — ABNORMAL HIGH (ref 0–99)
Triglycerides: 112 mg/dL (ref 0–149)
VLDL Cholesterol Cal: 21 mg/dL (ref 5–40)

## 2020-02-16 LAB — TSH: TSH: 3.21 u[IU]/mL (ref 0.450–4.500)

## 2020-02-22 ENCOUNTER — Telehealth (INDEPENDENT_AMBULATORY_CARE_PROVIDER_SITE_OTHER): Payer: Medicare Other | Admitting: Family Medicine

## 2020-02-22 ENCOUNTER — Encounter: Payer: Self-pay | Admitting: Family Medicine

## 2020-02-22 DIAGNOSIS — N400 Enlarged prostate without lower urinary tract symptoms: Secondary | ICD-10-CM

## 2020-02-22 DIAGNOSIS — F17200 Nicotine dependence, unspecified, uncomplicated: Secondary | ICD-10-CM | POA: Insufficient documentation

## 2020-02-22 DIAGNOSIS — E1169 Type 2 diabetes mellitus with other specified complication: Secondary | ICD-10-CM | POA: Diagnosis not present

## 2020-02-22 DIAGNOSIS — E782 Mixed hyperlipidemia: Secondary | ICD-10-CM

## 2020-02-22 DIAGNOSIS — R062 Wheezing: Secondary | ICD-10-CM | POA: Diagnosis not present

## 2020-02-22 MED ORDER — ALBUTEROL SULFATE HFA 108 (90 BASE) MCG/ACT IN AERS: 2.0000 | INHALATION_SPRAY | Freq: Four times a day (QID) | RESPIRATORY_TRACT | 3 refills | Status: DC | PRN

## 2020-02-22 NOTE — Progress Notes (Signed)
Virtual Visit via telephone Note  I connected with Colin Mitchell on 02/22/20 at 0800 by telephone and verified that I am speaking with the correct person using two identifiers. Colin Mitchell is currently located at home and wife are currently with her during visit. The provider, Fransisca Kaufmann Keontay Vora, MD is located in their office at time of visit.  Call ended at [name]  I discussed the limitations, risks, security and privacy concerns of performing an evaluation and management service by telephone and the availability of in person appointments. I also discussed with the patient that there may be a patient responsible charge related to this service. The patient expressed understanding and agreed to proceed.   History and Present Illness: Hyperlipidemia and CAD Patient is coming in for recheck of his hyperlipidemia. The patient is currently taking pravastatin. They deny any issues with myalgias or history of liver damage from it. They deny any focal numbness or weakness or chest pain.   BPH Patient is coming in for recheck on BPH Symptoms: none Medication: tamsulosin Last PSA: 0.6 on 02/15/20  Type 2 diabetes mellitus Patient comes in today for recheck of his diabetes. Patient has been currently taking no medication and a1c 6.6. Patient is not currently on an ACE inhibitor/ARB. Patient has not seen an ophthalmologist this year. Patient denies any issues with their feet.    Discussed smoking cessation and patient declines  No diagnosis found.  Outpatient Encounter Medications as of 02/22/2020  Medication Sig  . azelastine (ASTELIN) 0.1 % nasal spray Place 2 sprays into both nostrils 2 (two) times daily. Use in each nostril as directed  . clopidogrel (PLAVIX) 75 MG tablet Take 1 tablet (75 mg total) by mouth daily.  . fluticasone (FLONASE) 50 MCG/ACT nasal spray Place 1 spray into both nostrils 2 (two) times daily as needed for allergies or rhinitis.  . pravastatin (PRAVACHOL) 40 MG  tablet TAKE 1 TABLET ONCE DAILY  . tamsulosin (FLOMAX) 0.4 MG CAPS capsule TAKE 1 CAPSULE TWICE A DAY   No facility-administered encounter medications on file as of 02/22/2020.    Review of Systems  Constitutional: Negative for chills and fever.  Eyes: Negative for visual disturbance.  Respiratory: Positive for cough and wheezing. Negative for shortness of breath.   Cardiovascular: Negative for chest pain and leg swelling.  Musculoskeletal: Negative for back pain and gait problem.  Skin: Negative for rash.  Neurological: Negative for dizziness, weakness and light-headedness.  All other systems reviewed and are negative.   Observations/Objective: Patient sounds comfortable and in no acute distress  Assessment and Plan: Problem List Items Addressed This Visit      Genitourinary   BPH (benign prostatic hyperplasia)     Other   Hyperlipemia   Refused smoking cessation educational materials    Other Visit Diagnoses    Type 2 diabetes mellitus with other specified complication, without long-term current use of insulin ([place])    -  Primary   Wheezing       Relevant Medications   albuterol (VENTOLIN HFA) 108 (90 Base) MCG/ACT inhaler      Continue current medications and no changes Follow up plan: Return in about 6 months (around 08/23/2020), or if symptoms worsen or fail to improve, for DM and ht and hld.     I discussed the assessment and treatment plan with the patient. The patient was provided an opportunity to ask questions and all were answered. The patient agreed with the plan and demonstrated an  understanding of the instructions.   The patient was advised to call back or seek an in-person evaluation if the symptoms worsen or if the condition fails to improve as anticipated.  The above assessment and management plan was discussed with the patient. The patient verbalized understanding of and has agreed to the management plan. Patient is aware to call the clinic if symptoms  persist or worsen. Patient is aware when to return to the clinic for a follow-up visit. Patient educated on when it is appropriate to go to the emergency department.    I provided 20 minutes of non-face-to-face time during this encounter.    Worthy Rancher, MD

## 2020-04-24 DIAGNOSIS — H5213 Myopia, bilateral: Secondary | ICD-10-CM | POA: Diagnosis not present

## 2020-05-15 ENCOUNTER — Other Ambulatory Visit: Payer: Self-pay | Admitting: Family Medicine

## 2020-05-15 NOTE — Telephone Encounter (Signed)
Last OV 02/22/20. Last RF 02/22/20. Next OV not scheduled ° °

## 2020-05-30 DIAGNOSIS — H524 Presbyopia: Secondary | ICD-10-CM | POA: Diagnosis not present

## 2020-05-30 DIAGNOSIS — H52221 Regular astigmatism, right eye: Secondary | ICD-10-CM | POA: Diagnosis not present

## 2020-06-13 ENCOUNTER — Telehealth: Payer: Self-pay | Admitting: *Deleted

## 2020-06-13 ENCOUNTER — Ambulatory Visit (INDEPENDENT_AMBULATORY_CARE_PROVIDER_SITE_OTHER): Payer: Medicare Other | Admitting: *Deleted

## 2020-06-13 DIAGNOSIS — Z0000 Encounter for general adult medical examination without abnormal findings: Secondary | ICD-10-CM

## 2020-06-13 NOTE — Telephone Encounter (Signed)
Patient wants to know should he get the booster Covid vaccine. And do we give the booster?  He had Pfizer 12/22/19, 01/20/20.

## 2020-06-13 NOTE — Progress Notes (Signed)
MEDICARE ANNUAL WELLNESS VISIT  06/13/2020   Provider calling from [facility], patient participating in call from home.   Telephone Visit Disclaimer This Medicare AWV was conducted by telephone due to national recommendations for restrictions regarding the COVID-19 Pandemic (e.g. social distancing).  I verified, using two identifiers, that I am speaking with Colin Mitchell or their authorized healthcare agent. I discussed the limitations, risks, security, and privacy concerns of performing an evaluation and management service by telephone and the potential availability of an in-person appointment in the future. The patient expressed understanding and agreed to proceed.   Subjective:  Colin Mitchell is a 69 y.o. male patient of Dettinger, Fransisca Kaufmann, MD who had a Medicare Annual Wellness Visit today via telephone. Burdette is retired and lives with an adult companion. He has one daughter. He reports that he is not socially active and does interact with friends/family regularly. He is minimally physically active and enjoys music and watching TV.  Patient Care Team: Dettinger, Fransisca Kaufmann, MD as PCP - General (Family Medicine)  Advanced Directives 06/13/2020 06/07/2019  Does Patient Have a Medical Advance Directive? Yes No  Type of Advance Directive Out of facility DNR (pink MOST or yellow form) -  Does patient want to make changes to medical advance directive? No - Patient declined -  Would patient like information on creating a medical advance directive? - No - Patient declined    Hospital Utilization Over the Past 12 Months: # of hospitalizations or ER visits: 0 # of surgeries: 0  Review of Systems    Patient reports that his overall health is worse compared to last year.  History obtained from the patient and patient chart.   Patient Reported Readings (BP, Pulse, CBG, Weight, etc) none  Pain Assessment Pain : No/denies pain     Current Medications & Allergies (verified) Allergies  as of 06/13/2020      Reactions   Sulfa Antibiotics    Aspirin Rash   Ibuprofen Rash   Penicillins Rash      Medication List       Accurate as of June 13, 2020 10:25 AM. If you have any questions, ask your nurse or doctor.        albuterol 108 (90 Base) MCG/ACT inhaler Commonly known as: VENTOLIN HFA Inhale 2 puffs into the lungs every 6 (six) hours as needed for wheezing or shortness of breath.   azelastine 0.1 % nasal spray Commonly known as: ASTELIN Place 2 sprays into both nostrils 2 (two) times daily. Use in each nostril as directed   clopidogrel 75 MG tablet Commonly known as: PLAVIX TAKE 1 TABLET ONCE DAILY   fluticasone 50 MCG/ACT nasal spray Commonly known as: FLONASE Place 1 spray into both nostrils 2 (two) times daily as needed for allergies or rhinitis.   pravastatin 40 MG tablet Commonly known as: PRAVACHOL TAKE 1 TABLET ONCE DAILY   tamsulosin 0.4 MG Caps capsule Commonly known as: FLOMAX TAKE 1 CAPSULE TWICE A DAY       History (reviewed): Past Medical History:  Diagnosis Date  . Allergic rhinitis   . Benign prostatic hypertrophy   . CAD (coronary artery disease)   . Cancer ([place]) 2006, 2011   SCCA of the [place]; S/P radiation therapy 06/09/05 thru 07/31/05; H/O recurrence in 2011 and S/P resection on 08/22/10.  . Degenerative disc disease   . Depression    H/O depression  . Detached retina, left    H/O Detached retina  of left eye with visual deficit  . HLD (hyperlipidemia)   . Seizures ([place])    H/O seizures secondary to alcohol withdrawal  . Sickle cell anemia ([facility])    only in his eyes  . Throat cancer ([place])   . Thrombocytopenia ([place])    H/O thrombocytopenia   Past Surgical History:  Procedure Laterality Date  . PILONIDAL CYST EXCISION  remote  . THROAT SURGERY  2015   Family History  Problem Relation Age of Onset  . Cancer Mother        breast   . Diabetes Mother   . Hypertension Mother   . Cancer Father   . Cancer Brother         kidney   Social History   Socioeconomic History  . Marital status: Divorced    Spouse name: Not on file  . Number of children: 1  . Years of education: Not on file  . Highest education level: GED or equivalent  Occupational History  . Occupation: retired  Tobacco Use  . Smoking status: Current Every Day Smoker    Packs/day: 1.00    Years: 60.00    Pack years: 60.00    Types: Cigarettes  . Smokeless tobacco: Never Used  Vaping Use  . Vaping Use: Never used  Substance and Sexual Activity  . Alcohol use: Yes    Alcohol/week: 4.0 standard drinks    Types: 4 Cans of beer per week    Comment: 4 cans per week  . Drug use: Never  . Sexual activity: Not Currently  Other Topics Concern  . Not on file  Social History Narrative  . Not on file   Social Determinants of Health   Financial Resource Strain:   . Difficulty of Paying Living Expenses:   Food Insecurity:   . Worried About Charity fundraiser in the Last Year:   . Arboriculturist in the Last Year:   Transportation Needs:   . Film/video editor (Medical):   Marland Kitchen Lack of Transportation (Non-Medical):   Physical Activity:   . Days of Exercise per Week:   . Minutes of Exercise per Session:   Stress:   . Feeling of Stress :   Social Connections:   . Frequency of Communication with Friends and Family:   . Frequency of Social Gatherings with Friends and Family:   . Attends Religious Services:   . Active Member of Clubs or Organizations:   . Attends Archivist Meetings:   Marland Kitchen Marital Status:     Activities of Daily Living In your present state of health, do you have any difficulty performing the following activities: 06/13/2020  Hearing? N  Vision? Y  Difficulty concentrating or making decisions? N  Walking or climbing stairs? Y  Comment bad balance  Dressing or bathing? N  Doing errands, shopping? Y  Comment doesnt go out alone  Conservation officer, nature and eating ? N  Using the Toilet? N  In the past six  months, have you accidently leaked urine? N  Do you have problems with loss of bowel control? N  Managing your Medications? N  Managing your Finances? Y  Comment has assistance  Housekeeping or managing your Housekeeping? N  Some recent data might be hidden    Patient Education/ Literacy How often do you need to have someone help you when you read instructions, pamphlets, or other written materials from your doctor or pharmacy?: 1 - Never What is the last grade level  you completed in school?: 12th  Exercise Current Exercise Habits: The patient does not participate in regular exercise at present  Diet Patient reports consuming 1 meals a day and 0 snack(s) a day Patient reports that his primary diet is: Regular Patient reports that she does have regular access to food.   Depression Screen PHQ 2/9 Scores 06/13/2020 08/24/2019 06/07/2019 05/24/2019  PHQ - 2 Score 0 0 0 0     Fall Risk Fall Risk  06/13/2020 08/24/2019 06/07/2019 05/24/2019  Falls in the past year? 0 0 0 0     Objective:  Colin Mitchell seemed alert and oriented and he participated appropriately during our telephone visit.  Blood Pressure Weight BMI  BP Readings from Last 3 Encounters:  08/24/19 114/79  05/24/19 98/75  11/09/18 (!) 149/77   Wt Readings from Last 3 Encounters:  08/24/19 169 lb (76.7 kg)  05/24/19 161 lb 6.4 oz (73.2 kg)  01/03/13 165 lb (74.8 kg)   BMI Readings from Last 1 Encounters:  08/24/19 27.28 kg/m    *Unable to obtain current vital signs, weight, and BMI due to telephone visit type  Hearing/Vision  . Dailan did not seem to have difficulty with hearing/understanding during the telephone conversation . Reports that he has had a formal eye exam by an eye care professional within the past year . Reports that he has not had a formal hearing evaluation within the past year *Unable to fully assess hearing and vision during telephone visit type  Cognitive Function: 6CIT Screen 06/13/2020  06/07/2019  What Year? 0 points 0 points  What month? 0 points 0 points  What time? 0 points 0 points  Count back from 20 0 points 0 points  Months in reverse 0 points 0 points  Repeat phrase 2 points 0 points  Total Score 2 0   (Normal:0-7, Significant for Dysfunction: >8)  Normal Cognitive Function Screening: Yes   Immunization & Health Maintenance Record Immunization History  Administered Date(s) Administered  . PFIZER SARS-COV-2 Vaccination 12/22/2019, 01/20/2020  . Pneumococcal Conjugate-13 03/15/2018    Health Maintenance  Topic Date Due  . Hepatitis C Screening  Never done  . URINE MICROALBUMIN  Never done  . INFLUENZA VACCINE  05/27/2020  . COLONOSCOPY  08/23/2020 (Originally 08/28/2019)  . TETANUS/TDAP  08/23/2020 (Originally 08/04/1970)  . PNA vac Low Risk Adult (2 of 2 - PPSV23) 08/23/2020 (Originally 03/16/2019)  . COVID-19 Vaccine  Completed       Assessment  This is a routine wellness examination for [employer]  Health Maintenance: Due or Overdue Health Maintenance Due  Topic Date Due  . Hepatitis C Screening  Never done  . URINE MICROALBUMIN  Never done  . INFLUENZA VACCINE  05/27/2020    Colin Mitchell does not need a referral for Community Assistance: Care Management:   no Social Work:    no Prescription Assistance:  no Nutrition/Diabetes Education:  no   Plan:  Personalized Goals Goals Addressed            This Visit's Progress   . Patient Stated       06/13/2020 AWV Goal: Keep All Scheduled Appointments  Over the next year, patient will attend all scheduled appointments with their PCP and any specialists that they see.       Personalized Health Maintenance & Screening Recommendations    Lung Cancer Screening Recommended: no (Low Dose CT Chest recommended if Age 68-80 years, 30 pack-year currently smoking OR have quit w/in  past 15 years) Hepatitis C Screening recommended: yes HIV Screening recommended: no  Advanced  Directives: Written information was not prepared per patient's request.  Referrals & Orders No orders of the defined types were placed in this encounter.   Follow-up Plan . Follow-up with Dettinger, Fransisca Kaufmann, MD as planned   I have personally reviewed and noted the following in the patient's chart:   . Medical and social history . Use of alcohol, tobacco or illicit drugs  . Current medications and supplements . Functional ability and status . Nutritional status . Physical activity . Advanced directives . List of other physicians . Hospitalizations, surgeries, and ER visits in previous 12 months . Vitals . Screenings to include cognitive, depression, and falls . Referrals and appointments  In addition, I have reviewed and discussed with Colin Mitchell certain preventive protocols, quality metrics, and best practice recommendations. A written personalized care plan for preventive services as well as general preventive health recommendations is available and can be mailed to the patient at his request.      Baldomero Lamy, LPN  10/27/7508

## 2020-06-13 NOTE — Telephone Encounter (Signed)
Right now the booster is only approved for people who have HIV or under cancer treatment or are on immunosuppressants he does not qualify based on the current recommendations, this may change in the near future.

## 2020-06-13 NOTE — Telephone Encounter (Signed)
Patient aware and verbalized understanding. °

## 2020-08-13 ENCOUNTER — Other Ambulatory Visit: Payer: Self-pay | Admitting: Family Medicine

## 2020-08-24 ENCOUNTER — Ambulatory Visit: Payer: Medicare Other | Admitting: Family Medicine

## 2020-11-12 ENCOUNTER — Other Ambulatory Visit: Payer: Self-pay | Admitting: Family Medicine

## 2020-12-10 ENCOUNTER — Other Ambulatory Visit: Payer: Self-pay | Admitting: Family Medicine

## 2021-01-17 ENCOUNTER — Other Ambulatory Visit: Payer: Self-pay

## 2021-01-17 ENCOUNTER — Encounter: Payer: Self-pay | Admitting: Family Medicine

## 2021-01-17 ENCOUNTER — Ambulatory Visit (INDEPENDENT_AMBULATORY_CARE_PROVIDER_SITE_OTHER): Payer: Medicare Other | Admitting: Family Medicine

## 2021-01-17 VITALS — BP 102/67 | HR 86 | Ht 66.0 in | Wt 169.0 lb

## 2021-01-17 DIAGNOSIS — E782 Mixed hyperlipidemia: Secondary | ICD-10-CM | POA: Diagnosis not present

## 2021-01-17 DIAGNOSIS — R062 Wheezing: Secondary | ICD-10-CM

## 2021-01-17 DIAGNOSIS — J3089 Other allergic rhinitis: Secondary | ICD-10-CM | POA: Diagnosis not present

## 2021-01-17 DIAGNOSIS — E1169 Type 2 diabetes mellitus with other specified complication: Secondary | ICD-10-CM

## 2021-01-17 DIAGNOSIS — Z1211 Encounter for screening for malignant neoplasm of colon: Secondary | ICD-10-CM | POA: Diagnosis not present

## 2021-01-17 DIAGNOSIS — N400 Enlarged prostate without lower urinary tract symptoms: Secondary | ICD-10-CM

## 2021-01-17 DIAGNOSIS — E119 Type 2 diabetes mellitus without complications: Secondary | ICD-10-CM | POA: Insufficient documentation

## 2021-01-17 DIAGNOSIS — Z716 Tobacco abuse counseling: Secondary | ICD-10-CM

## 2021-01-17 LAB — BAYER DCA HB A1C WAIVED: HB A1C (BAYER DCA - WAIVED): 6.2 % (ref <7.0)

## 2021-01-17 MED ORDER — CLOPIDOGREL BISULFATE 75 MG PO TABS: 75.0000 mg | ORAL_TABLET | Freq: Every day | ORAL | 3 refills | Status: DC

## 2021-01-17 MED ORDER — FLUTICASONE PROPIONATE 50 MCG/ACT NA SUSP: 1.0000 | Freq: Two times a day (BID) | NASAL | 6 refills | Status: DC | PRN

## 2021-01-17 MED ORDER — ALBUTEROL SULFATE HFA 108 (90 BASE) MCG/ACT IN AERS
2.0000 | INHALATION_SPRAY | Freq: Four times a day (QID) | RESPIRATORY_TRACT | 3 refills | Status: DC | PRN
Start: 2021-01-17 — End: 2022-02-24

## 2021-01-17 MED ORDER — TAMSULOSIN HCL 0.4 MG PO CAPS: 0.4000 mg | ORAL_CAPSULE | Freq: Two times a day (BID) | ORAL | 3 refills | Status: DC

## 2021-01-17 MED ORDER — PRAVASTATIN SODIUM 40 MG PO TABS: 40.0000 mg | ORAL_TABLET | Freq: Every day | ORAL | 3 refills | Status: DC

## 2021-01-17 MED ORDER — BUPROPION HCL ER (XL) 150 MG PO TB24: 150.0000 mg | ORAL_TABLET | Freq: Every day | ORAL | 2 refills | Status: DC

## 2021-01-17 MED ORDER — AZELASTINE HCL 0.1 % NA SOLN: 2.0000 | Freq: Two times a day (BID) | NASAL | 2 refills | Status: DC

## 2021-01-17 NOTE — Progress Notes (Signed)
BP 102/67   Pulse 86   Ht '5\' 6"'  (1.676 m)   Wt 169 lb (76.7 kg)   SpO2 99%   BMI 27.28 kg/m    Subjective:   Patient ID: Colin Mitchell, male    DOB: 03/29/1951, 70 y.o.   MRN: [id number]  HPI: Colin Mitchell is a 70 y.o. male presenting on 01/17/2021 for Medical Management of Chronic Issues, Hyperlipidemia, and Diabetes   HPI Type 2 diabetes mellitus Patient comes in today for recheck of his diabetes. Patient has been currently taking no medication currently, will check A1c. Patient is not currently on an ACE inhibitor/ARB. Patient has not seen an ophthalmologist this year. Patient denies any issues with their feet. The symptom started onset as an adult hyperlipidemia ARE RELATED TO DM   Hyperlipidemia Patient is coming in for recheck of his hyperlipidemia. The patient is currently taking pravastatin. They deny any issues with myalgias or history of liver damage from it. They deny any focal numbness or weakness or chest pain.   BPH Patient is coming in for recheck on BPH Symptoms: Some urinary hesitancy Medication: Flomax Last PSA: 2020, normal  Discussed smoking cessation counseling, is currently smoking 1 pack daily.  He does have some mild occasional wheezing which is likely a result of COPD, he wants to try Wellbutrin.  Relevant past medical, surgical, family and social history reviewed and updated as indicated. Interim medical history since our last visit reviewed. Allergies and medications reviewed and updated.  Review of Systems  Constitutional: Negative for chills and fever.  Eyes: Negative for visual disturbance.  Respiratory: Negative for shortness of breath and wheezing.   Cardiovascular: Negative for chest pain and leg swelling.  Musculoskeletal: Negative for back pain and gait problem.  Skin: Negative for rash.  Neurological: Negative for dizziness, weakness and numbness.  All other systems reviewed and are negative.   Per HPI unless specifically  indicated above   Allergies as of 01/17/2021      Reactions   Sulfa Antibiotics    Aspirin Rash   Ibuprofen Rash   Penicillins Rash      Medication List       Accurate as of January 17, 2021  8:10 AM. If you have any questions, ask your nurse or doctor.        albuterol 108 (90 Base) MCG/ACT inhaler Commonly known as: VENTOLIN HFA Inhale 2 puffs into the lungs every 6 (six) hours as needed for wheezing or shortness of breath.   azelastine 0.1 % nasal spray Commonly known as: ASTELIN Place 2 sprays into both nostrils 2 (two) times daily. Use in each nostril as directed   clopidogrel 75 MG tablet Commonly known as: PLAVIX TAKE 1 TABLET ONCE DAILY   fluticasone 50 MCG/ACT nasal spray Commonly known as: FLONASE Place 1 spray into both nostrils 2 (two) times daily as needed for allergies or rhinitis.   pravastatin 40 MG tablet Commonly known as: PRAVACHOL TAKE 1 TABLET ONCE DAILY   tamsulosin 0.4 MG Caps capsule Commonly known as: FLOMAX TAKE 1 CAPSULE TWICE A DAY        Objective:   BP 102/67   Pulse 86   Ht '5\' 6"'  (1.676 m)   Wt 169 lb (76.7 kg)   SpO2 99%   BMI 27.28 kg/m   Wt Readings from Last 3 Encounters:  01/17/21 169 lb (76.7 kg)  08/24/19 169 lb (76.7 kg)  05/24/19 161 lb 6.4 oz (73.2 kg)  Physical Exam Vitals and nursing note reviewed.  Constitutional:      General: He is not in acute distress.    Appearance: He is well-developed. He is not diaphoretic.  Eyes:     General: No scleral icterus.    Conjunctiva/sclera: Conjunctivae normal.  Neck:     Thyroid: No thyromegaly.  Cardiovascular:     Rate and Rhythm: Normal rate and regular rhythm.     Heart sounds: Normal heart sounds. No murmur heard.   Pulmonary:     Effort: Pulmonary effort is normal. No respiratory distress.     Breath sounds: Normal breath sounds. No wheezing.  Musculoskeletal:        General: Normal range of motion.     Cervical back: Neck supple.  Lymphadenopathy:      Cervical: No cervical adenopathy.  Skin:    General: Skin is warm and dry.     Findings: No rash.  Neurological:     Mental Status: He is alert and oriented to person, place, and time.     Coordination: Coordination normal.  Psychiatric:        Behavior: Behavior normal.     Assessment & Plan:   Problem List Items Addressed This Visit      Endocrine   Diabetes mellitus ([place]) - Primary   Relevant Medications   pravastatin (PRAVACHOL) 40 MG tablet   Other Relevant Orders   CBC with Differential/Platelet   CMP14+EGFR   Lipid panel   TSH   Bayer DCA Hb A1c Waived   Microalbumin / creatinine urine ratio   Hepatitis C antibody     Genitourinary   BPH (benign prostatic hyperplasia)   Relevant Medications   tamsulosin (FLOMAX) 0.4 MG CAPS capsule   Other Relevant Orders   PSA, total and free     Other   Hyperlipemia   Relevant Medications   pravastatin (PRAVACHOL) 40 MG tablet   Other Relevant Orders   CBC with Differential/Platelet   CMP14+EGFR   Lipid panel   TSH   Bayer DCA Hb A1c Waived   Microalbumin / creatinine urine ratio   Hepatitis C antibody    Other Visit Diagnoses    Colon cancer screening       Relevant Orders   Ambulatory referral to Gastroenterology   Wheezing       Relevant Medications   albuterol (VENTOLIN HFA) 108 (90 Base) MCG/ACT inhaler   Non-seasonal allergic rhinitis due to other allergic trigger       Relevant Medications   fluticasone (FLONASE) 50 MCG/ACT nasal spray   Encounter for smoking cessation counseling       Relevant Medications   buPROPion (WELLBUTRIN XL) 150 MG 24 hr tablet      Will start Wellbutrin for smoking cessation, continue other medicine currently, will check A1c and blood work and follow-up from there.  Patient believes microalbumin today, did referral to colonoscopy Follow up plan: Return in about 3 months (around 04/19/2021), or if symptoms worsen or fail to improve, for Diabetes and smoking and  hyperlipidemia.  Counseling provided for all of the vaccine components No orders of the defined types were placed in this encounter.   Caryl Pina, MD [facility] 01/17/2021, 8:10 AM

## 2021-01-18 LAB — CMP14+EGFR
ALT: 12 IU/L (ref 0–44)
AST: 16 IU/L (ref 0–40)
Albumin/Globulin Ratio: 1.4 (ref 1.2–2.2)
Albumin: 4.5 g/dL (ref 3.8–4.8)
Alkaline Phosphatase: 59 IU/L (ref 44–121)
BUN/Creatinine Ratio: 9 — ABNORMAL LOW (ref 10–24)
BUN: 8 mg/dL (ref 8–27)
Bilirubin Total: 0.3 mg/dL (ref 0.0–1.2)
CO2: 23 mmol/L (ref 20–29)
Calcium: 9.7 mg/dL (ref 8.6–10.2)
Chloride: 100 mmol/L (ref 96–106)
Creatinine, Ser: 0.87 mg/dL (ref 0.76–1.27)
Globulin, Total: 3.3 g/dL (ref 1.5–4.5)
Glucose: 96 mg/dL (ref 65–99)
Potassium: 4.4 mmol/L (ref 3.5–5.2)
Sodium: 138 mmol/L (ref 134–144)
Total Protein: 7.8 g/dL (ref 6.0–8.5)
eGFR: 93 mL/min/{1.73_m2} (ref >59)

## 2021-01-18 LAB — PSA, TOTAL AND FREE
PSA, Free Pct: 33.6 %
PSA, Free: 0.47 ng/mL
Prostate Specific Ag, Serum: 1.4 ng/mL (ref 0.0–4.0)

## 2021-01-18 LAB — CBC WITH DIFFERENTIAL/PLATELET
Basophils Absolute: 0 10*3/uL (ref 0.0–0.2)
Basos: 1 %
EOS (ABSOLUTE): 0.1 10*3/uL (ref 0.0–0.4)
Eos: 2 %
Hematocrit: 42.5 % (ref 37.5–51.0)
Hemoglobin: 14.3 g/dL (ref 13.0–17.7)
Immature Grans (Abs): 0 10*3/uL (ref 0.0–0.1)
Immature Granulocytes: 0 %
Lymphocytes Absolute: 0.9 10*3/uL (ref 0.7–3.1)
Lymphs: 27 %
MCH: 27.4 pg (ref 26.6–33.0)
MCHC: 33.6 g/dL (ref 31.5–35.7)
MCV: 82 fL (ref 79–97)
Monocytes Absolute: 0.3 10*3/uL (ref 0.1–0.9)
Monocytes: 10 %
Neutrophils Absolute: 2 10*3/uL (ref 1.4–7.0)
Neutrophils: 60 %
Platelets: 164 10*3/uL (ref 150–450)
RBC: 5.21 x10E6/uL (ref 4.14–5.80)
RDW: 14.3 % (ref 11.6–15.4)
WBC: 3.3 10*3/uL — ABNORMAL LOW (ref 3.4–10.8)

## 2021-01-18 LAB — TSH: TSH: 2.97 u[IU]/mL (ref 0.450–4.500)

## 2021-01-18 LAB — MICROALBUMIN / CREATININE URINE RATIO
Creatinine, Urine: 89.2 mg/dL
Microalb/Creat Ratio: 22 mg/g creat (ref 0–29)
Microalbumin, Urine: 20 ug/mL

## 2021-01-18 LAB — LIPID PANEL
Chol/HDL Ratio: 5.4 ratio — ABNORMAL HIGH (ref 0.0–5.0)
Cholesterol, Total: 189 mg/dL (ref 100–199)
HDL: 35 mg/dL — ABNORMAL LOW (ref >39)
LDL Chol Calc (NIH): 116 mg/dL — ABNORMAL HIGH (ref 0–99)
Triglycerides: 216 mg/dL — ABNORMAL HIGH (ref 0–149)
VLDL Cholesterol Cal: 38 mg/dL (ref 5–40)

## 2021-01-18 LAB — HEPATITIS C ANTIBODY: Hep C Virus Ab: <0.1 s/co ratio (ref 0.0–0.9)

## 2021-01-23 ENCOUNTER — Other Ambulatory Visit: Payer: Self-pay | Admitting: Family Medicine

## 2021-01-23 DIAGNOSIS — Z1211 Encounter for screening for malignant neoplasm of colon: Secondary | ICD-10-CM

## 2021-01-23 NOTE — Progress Notes (Unsigned)
Did new referral for colonoscopy

## 2021-02-01 ENCOUNTER — Encounter: Payer: Self-pay | Admitting: Family Medicine

## 2021-04-17 ENCOUNTER — Ambulatory Visit (INDEPENDENT_AMBULATORY_CARE_PROVIDER_SITE_OTHER): Payer: Medicare Other | Admitting: Family Medicine

## 2021-04-17 ENCOUNTER — Other Ambulatory Visit: Payer: Self-pay

## 2021-04-17 ENCOUNTER — Encounter: Payer: Self-pay | Admitting: Family Medicine

## 2021-04-17 VITALS — BP 93/66 | HR 84 | Ht 66.0 in | Wt 164.0 lb

## 2021-04-17 DIAGNOSIS — E1169 Type 2 diabetes mellitus with other specified complication: Secondary | ICD-10-CM

## 2021-04-17 DIAGNOSIS — E782 Mixed hyperlipidemia: Secondary | ICD-10-CM | POA: Diagnosis not present

## 2021-04-17 DIAGNOSIS — Z716 Tobacco abuse counseling: Secondary | ICD-10-CM | POA: Diagnosis not present

## 2021-04-17 DIAGNOSIS — N400 Enlarged prostate without lower urinary tract symptoms: Secondary | ICD-10-CM

## 2021-04-17 LAB — BAYER DCA HB A1C WAIVED: HB A1C (BAYER DCA - WAIVED): 5.2 % (ref <7.0)

## 2021-04-17 NOTE — Progress Notes (Signed)
BP 93/66   Pulse 84   Ht '5\' 6"'  (1.676 m)   Wt 164 lb (74.4 kg)   SpO2 97%   BMI 26.47 kg/m    Subjective:   Patient ID: Colin Mitchell, male    DOB: 1951/03/23, 70 y.o.   MRN: [id number]  HPI: Colin Mitchell is a 70 y.o. male presenting on 04/17/2021 for Medical Management of Chronic Issues, Hypertension, and Hyperlipidemia   HPI Type 2 diabetes mellitus Patient comes in today for recheck of his diabetes. Patient has been currently taking no medication and has been diet controlled. Patient is not currently on an ACE inhibitor/ARB. Patient has not seen an ophthalmologist this year. Patient denies any issues with their feet. The symptom started onset as an adult hyperlipidemia ARE RELATED TO DM   Hyperlipidemia Patient is coming in for recheck of his hyperlipidemia. The patient is currently taking pravastatin. They deny any issues with myalgias or history of liver damage from it. They deny any focal numbness or weakness or chest pain.   BPH Patient is coming in for recheck on BPH Symptoms: None currently Medication: Flomax Last PSA: 3 months ago, normal  Tobacco abuse Patient was trying Wellbutrin for smoking cessation and says he feels like he smoked more.  He says he tried Chantix and patches and counseled does not want to try anything else right now  Relevant past medical, surgical, family and social history reviewed and updated as indicated. Interim medical history since our last visit reviewed. Allergies and medications reviewed and updated.  Review of Systems  Constitutional:  Negative for chills and fever.  Eyes:  Negative for visual disturbance.  Respiratory:  Negative for shortness of breath and wheezing.   Cardiovascular:  Negative for chest pain and leg swelling.  Musculoskeletal:  Negative for back pain and gait problem.  Skin:  Negative for rash.  All other systems reviewed and are negative.  Per HPI unless specifically indicated above   Allergies as of  04/17/2021       Reactions   Sulfa Antibiotics    Aspirin Rash   Ibuprofen Rash   Penicillins Rash        Medication List        Accurate as of April 17, 2021  8:28 AM. If you have any questions, ask your nurse or doctor.          STOP taking these medications    buPROPion 150 MG 24 hr tablet Commonly known as: Wellbutrin XL Stopped by: Fransisca Kaufmann Shantana Christon, MD       TAKE these medications    albuterol 108 (90 Base) MCG/ACT inhaler Commonly known as: VENTOLIN HFA Inhale 2 puffs into the lungs every 6 (six) hours as needed for wheezing or shortness of breath.   azelastine 0.1 % nasal spray Commonly known as: ASTELIN Place 2 sprays into both nostrils 2 (two) times daily. Use in each nostril as directed   clopidogrel 75 MG tablet Commonly known as: PLAVIX Take 1 tablet (75 mg total) by mouth daily.   fluticasone 50 MCG/ACT nasal spray Commonly known as: FLONASE Place 1 spray into both nostrils 2 (two) times daily as needed for allergies or rhinitis.   pravastatin 40 MG tablet Commonly known as: PRAVACHOL Take 1 tablet (40 mg total) by mouth daily.   tamsulosin 0.4 MG Caps capsule Commonly known as: FLOMAX Take 1 capsule (0.4 mg total) by mouth 2 (two) times daily.  Objective:   BP 93/66   Pulse 84   Ht '5\' 6"'  (1.676 m)   Wt 164 lb (74.4 kg)   SpO2 97%   BMI 26.47 kg/m   Wt Readings from Last 3 Encounters:  04/17/21 164 lb (74.4 kg)  01/17/21 169 lb (76.7 kg)  08/24/19 169 lb (76.7 kg)    Physical Exam Vitals and nursing note reviewed.  Constitutional:      General: He is not in acute distress.    Appearance: He is well-developed. He is not diaphoretic.  Eyes:     General: No scleral icterus.    Conjunctiva/sclera: Conjunctivae normal.  Neck:     Thyroid: No thyromegaly.  Cardiovascular:     Rate and Rhythm: Normal rate and regular rhythm.     Heart sounds: Normal heart sounds. No murmur heard. Pulmonary:     Effort: Pulmonary  effort is normal. No respiratory distress.     Breath sounds: Normal breath sounds. No wheezing, rhonchi or rales.  Musculoskeletal:        General: Normal range of motion.     Cervical back: Neck supple.  Lymphadenopathy:     Cervical: No cervical adenopathy.  Skin:    General: Skin is warm and dry.     Findings: No rash.  Neurological:     Mental Status: He is alert and oriented to person, place, and time.     Coordination: Coordination normal.  Psychiatric:        Behavior: Behavior normal.      Assessment & Plan:   Problem List Items Addressed This Visit       Endocrine   Diabetes mellitus ([place]) - Primary   Relevant Orders   CBC with Differential/Platelet   CMP14+EGFR   Lipid panel   Bayer DCA Hb A1c Waived     Genitourinary   BPH (benign prostatic hyperplasia)     Other   Hyperlipemia   Relevant Orders   CBC with Differential/Platelet   CMP14+EGFR   Lipid panel   Bayer DCA Hb A1c Waived   Other Visit Diagnoses     Encounter for smoking cessation counseling           Patient does not want to try anything today for smoking cessation, will stop the Wellbutrin because he says it did not work  A1c is 5.2, looks good Follow up plan: Return in about 3 months (around 07/18/2021), or if symptoms worsen or fail to improve, for Diabetes and hyperlipidemia and BPH.  Counseling provided for all of the vaccine components Orders Placed This Encounter  Procedures   CBC with Differential/Platelet   CMP14+EGFR   Lipid panel   Bayer DCA Hb A1c [place], MD [facility] 04/17/2021, 8:28 AM

## 2021-04-25 LAB — CMP14+EGFR
ALT: 5 IU/L (ref 0–44)
AST: 14 IU/L (ref 0–40)
Albumin/Globulin Ratio: 1.8 (ref 1.2–2.2)
Albumin: 5.2 g/dL — ABNORMAL HIGH (ref 3.8–4.8)
Alkaline Phosphatase: 71 IU/L (ref 44–121)
BUN/Creatinine Ratio: 9 — ABNORMAL LOW (ref 10–24)
BUN: 11 mg/dL (ref 8–27)
Bilirubin Total: <0.2 mg/dL (ref 0.0–1.2)
CO2: 20 mmol/L (ref 20–29)
Calcium: 9.8 mg/dL (ref 8.6–10.2)
Chloride: 115 mmol/L — ABNORMAL HIGH (ref 96–106)
Creatinine, Ser: 1.19 mg/dL (ref 0.76–1.27)
Globulin, Total: 2.9 g/dL (ref 1.5–4.5)
Glucose: 104 mg/dL — ABNORMAL HIGH (ref 65–99)
Potassium: 5.1 mmol/L (ref 3.5–5.2)
Sodium: 158 mmol/L — ABNORMAL HIGH (ref 134–144)
Total Protein: 8.1 g/dL (ref 6.0–8.5)
eGFR: 66 mL/min/{1.73_m2} (ref >59)

## 2021-04-25 LAB — CBC WITH DIFFERENTIAL/PLATELET
Basophils Absolute: 0 10*3/uL (ref 0.0–0.2)
Basos: 1 %
EOS (ABSOLUTE): 0.1 10*3/uL (ref 0.0–0.4)
Eos: 5 %
Hematocrit: 43.7 % (ref 37.5–51.0)
Hemoglobin: 14.4 g/dL (ref 13.0–17.7)
Immature Grans (Abs): 0 10*3/uL (ref 0.0–0.1)
Immature Granulocytes: 0 %
Lymphocytes Absolute: 1.1 10*3/uL (ref 0.7–3.1)
Lymphs: 36 %
MCH: 26.4 pg — ABNORMAL LOW (ref 26.6–33.0)
MCHC: 33 g/dL (ref 31.5–35.7)
MCV: 80 fL (ref 79–97)
Monocytes Absolute: 0.4 10*3/uL (ref 0.1–0.9)
Monocytes: 12 %
Neutrophils Absolute: 1.5 10*3/uL (ref 1.4–7.0)
Neutrophils: 46 %
Platelets: 173 10*3/uL (ref 150–450)
RBC: 5.46 x10E6/uL (ref 4.14–5.80)
RDW: 14.3 % (ref 11.6–15.4)
WBC: 3.1 10*3/uL — ABNORMAL LOW (ref 3.4–10.8)

## 2021-04-25 LAB — LIPID PANEL
Chol/HDL Ratio: 5.2 ratio — ABNORMAL HIGH (ref 0.0–5.0)
Cholesterol, Total: 202 mg/dL — ABNORMAL HIGH (ref 100–199)
HDL: 39 mg/dL — ABNORMAL LOW (ref >39)
LDL Chol Calc (NIH): 137 mg/dL — ABNORMAL HIGH (ref 0–99)
Triglycerides: 145 mg/dL (ref 0–149)
VLDL Cholesterol Cal: 26 mg/dL (ref 5–40)

## 2021-05-07 DIAGNOSIS — Z85819 Personal history of malignant neoplasm of unspecified site of lip, oral cavity, and pharynx: Secondary | ICD-10-CM | POA: Diagnosis not present

## 2021-05-13 ENCOUNTER — Other Ambulatory Visit: Payer: Self-pay | Admitting: Otolaryngology

## 2021-05-13 DIAGNOSIS — Z85819 Personal history of malignant neoplasm of unspecified site of lip, oral cavity, and pharynx: Secondary | ICD-10-CM

## 2021-05-27 ENCOUNTER — Other Ambulatory Visit: Payer: Medicare Other

## 2021-07-15 ENCOUNTER — Other Ambulatory Visit: Payer: Self-pay

## 2021-07-15 ENCOUNTER — Telehealth: Payer: Self-pay | Admitting: Family Medicine

## 2021-07-15 DIAGNOSIS — E782 Mixed hyperlipidemia: Secondary | ICD-10-CM

## 2021-07-15 DIAGNOSIS — E1169 Type 2 diabetes mellitus with other specified complication: Secondary | ICD-10-CM

## 2021-07-15 NOTE — Telephone Encounter (Signed)
Pt aware and will come 3d prior to appt for labs

## 2021-07-24 ENCOUNTER — Ambulatory Visit (INDEPENDENT_AMBULATORY_CARE_PROVIDER_SITE_OTHER): Payer: Medicare Other

## 2021-07-24 ENCOUNTER — Encounter: Payer: Self-pay | Admitting: Family Medicine

## 2021-07-24 ENCOUNTER — Ambulatory Visit (INDEPENDENT_AMBULATORY_CARE_PROVIDER_SITE_OTHER): Payer: Medicare Other | Admitting: Family Medicine

## 2021-07-24 ENCOUNTER — Other Ambulatory Visit: Payer: Self-pay

## 2021-07-24 VITALS — BP 93/64 | HR 71 | Ht 66.0 in | Wt 162.0 lb

## 2021-07-24 DIAGNOSIS — F17210 Nicotine dependence, cigarettes, uncomplicated: Secondary | ICD-10-CM

## 2021-07-24 DIAGNOSIS — E782 Mixed hyperlipidemia: Secondary | ICD-10-CM

## 2021-07-24 DIAGNOSIS — N400 Enlarged prostate without lower urinary tract symptoms: Secondary | ICD-10-CM

## 2021-07-24 DIAGNOSIS — J439 Emphysema, unspecified: Secondary | ICD-10-CM | POA: Diagnosis not present

## 2021-07-24 DIAGNOSIS — J449 Chronic obstructive pulmonary disease, unspecified: Secondary | ICD-10-CM | POA: Diagnosis not present

## 2021-07-24 DIAGNOSIS — E1169 Type 2 diabetes mellitus with other specified complication: Secondary | ICD-10-CM | POA: Diagnosis not present

## 2021-07-24 LAB — CMP14+EGFR
ALT: 11 IU/L (ref 0–44)
AST: 17 IU/L (ref 0–40)
Albumin/Globulin Ratio: 1.6 (ref 1.2–2.2)
Albumin: 4.7 g/dL (ref 3.8–4.8)
Alkaline Phosphatase: 59 IU/L (ref 44–121)
BUN/Creatinine Ratio: 11 (ref 10–24)
BUN: 11 mg/dL (ref 8–27)
Bilirubin Total: 0.4 mg/dL (ref 0.0–1.2)
CO2: 24 mmol/L (ref 20–29)
Calcium: 9.6 mg/dL (ref 8.6–10.2)
Chloride: 100 mmol/L (ref 96–106)
Creatinine, Ser: 1.04 mg/dL (ref 0.76–1.27)
Globulin, Total: 3 g/dL (ref 1.5–4.5)
Glucose: 110 mg/dL — ABNORMAL HIGH (ref 70–99)
Potassium: 4.4 mmol/L (ref 3.5–5.2)
Sodium: 140 mmol/L (ref 134–144)
Total Protein: 7.7 g/dL (ref 6.0–8.5)
eGFR: 78 mL/min/{1.73_m2} (ref >59)

## 2021-07-24 LAB — CBC WITH DIFFERENTIAL/PLATELET
Basophils Absolute: 0 10*3/uL (ref 0.0–0.2)
Basos: 1 %
EOS (ABSOLUTE): 0.1 10*3/uL (ref 0.0–0.4)
Eos: 3 %
Hematocrit: 42.4 % (ref 37.5–51.0)
Hemoglobin: 13.9 g/dL (ref 13.0–17.7)
Immature Grans (Abs): 0 10*3/uL (ref 0.0–0.1)
Immature Granulocytes: 0 %
Lymphocytes Absolute: 0.9 10*3/uL (ref 0.7–3.1)
Lymphs: 32 %
MCH: 25.9 pg — ABNORMAL LOW (ref 26.6–33.0)
MCHC: 32.8 g/dL (ref 31.5–35.7)
MCV: 79 fL (ref 79–97)
Monocytes Absolute: 0.4 10*3/uL (ref 0.1–0.9)
Monocytes: 14 %
Neutrophils Absolute: 1.4 10*3/uL (ref 1.4–7.0)
Neutrophils: 50 %
Platelets: 169 10*3/uL (ref 150–450)
RBC: 5.37 x10E6/uL (ref 4.14–5.80)
RDW: 14.4 % (ref 11.6–15.4)
WBC: 2.8 10*3/uL — ABNORMAL LOW (ref 3.4–10.8)

## 2021-07-24 LAB — LIPID PANEL
Chol/HDL Ratio: 3.7 ratio (ref 0.0–5.0)
Cholesterol, Total: 139 mg/dL (ref 100–199)
HDL: 38 mg/dL — ABNORMAL LOW (ref >39)
LDL Chol Calc (NIH): 83 mg/dL (ref 0–99)
Triglycerides: 95 mg/dL (ref 0–149)
VLDL Cholesterol Cal: 18 mg/dL (ref 5–40)

## 2021-07-24 LAB — BAYER DCA HB A1C WAIVED: HB A1C (BAYER DCA - WAIVED): 5.9 % — ABNORMAL HIGH (ref 4.8–5.6)

## 2021-07-24 NOTE — Progress Notes (Signed)
BP 93/64   Pulse 71   Ht 5\' 6"  (1.676 m)   Wt 162 lb (73.5 kg)   SpO2 99%   BMI 26.15 kg/m    Subjective:   Patient ID: Colin Mitchell, male    DOB: [birth year], 70 y.o.   MRN: [id number]  HPI: Colin Mitchell is a 70 y.o. male presenting on 07/24/2021 for Medical Management of Chronic Issues, Hyperlipidemia, and Diabetes   HPI Type 2 diabetes mellitus Patient comes in today for recheck of his diabetes. Patient has been currently taking A1c looks good again at 5.9. Patient is not currently on an ACE inhibitor/ARB because of low blood pressures. Patient has not seen an ophthalmologist this year. Patient denies any issues with their feet. The symptom started onset as an adult hyperlipidemia ARE RELATED TO DM   Hyperlipidemia Patient is coming in for recheck of his hyperlipidemia. The patient is currently taking pravastatin. They deny any issues with myalgias or history of liver damage from it. They deny any focal numbness or weakness or chest pain.   BPH Patient is coming in for recheck on BPH Symptoms: None currently Medication: Tamsulosin Last PSA: Within a year, normal, 1.4  Patient has a 30-pack-year history or greater with still smoking.  We will do chest x-ray  Relevant past medical, surgical, family and social history reviewed and updated as indicated. Interim medical history since our last visit reviewed. Allergies and medications reviewed and updated.  Review of Systems  Constitutional:  Negative for chills and fever.  Eyes:  Negative for visual disturbance.  Respiratory:  Negative for shortness of breath and wheezing.   Cardiovascular:  Negative for chest pain and leg swelling.  Musculoskeletal:  Negative for back pain and gait problem.  Skin:  Negative for rash.  Neurological:  Negative for dizziness, weakness and light-headedness.  All other systems reviewed and are negative.  Per HPI unless specifically indicated above   Allergies as of 07/24/2021        Reactions   Sulfa Antibiotics    Aspirin Rash   Ibuprofen Rash   Penicillins Rash        Medication List        Accurate as of July 24, 2021  8:39 AM. If you have any questions, ask your nurse or doctor.          albuterol 108 (90 Base) MCG/ACT inhaler Commonly known as: VENTOLIN HFA Inhale 2 puffs into the lungs every 6 (six) hours as needed for wheezing or shortness of breath.   azelastine 0.1 % nasal spray Commonly known as: ASTELIN Place 2 sprays into both nostrils 2 (two) times daily. Use in each nostril as directed   clopidogrel 75 MG tablet Commonly known as: PLAVIX Take 1 tablet (75 mg total) by mouth daily.   fluticasone 50 MCG/ACT nasal spray Commonly known as: FLONASE Place 1 spray into both nostrils 2 (two) times daily as needed for allergies or rhinitis.   pravastatin 40 MG tablet Commonly known as: PRAVACHOL Take 1 tablet (40 mg total) by mouth daily.   tamsulosin 0.4 MG Caps capsule Commonly known as: FLOMAX Take 1 capsule (0.4 mg total) by mouth 2 (two) times daily.         Objective:   BP 93/64   Pulse 71   Ht 5\' 6"  (1.676 m)   Wt 162 lb (73.5 kg)   SpO2 99%   BMI 26.15 kg/m   Wt Readings from Last 3 Encounters:  07/24/21  162 lb (73.5 kg)  04/17/21 164 lb (74.4 kg)  01/17/21 169 lb (76.7 kg)    Physical Exam Vitals and nursing note reviewed.  Constitutional:      General: He is not in acute distress.    Appearance: He is well-developed. He is not diaphoretic.  Eyes:     General: No scleral icterus.    Conjunctiva/sclera: Conjunctivae normal.  Neck:     Thyroid: No thyromegaly.  Cardiovascular:     Rate and Rhythm: Normal rate and regular rhythm.     Heart sounds: Normal heart sounds. No murmur heard. Pulmonary:     Effort: Pulmonary effort is normal. No respiratory distress.     Breath sounds: Normal breath sounds. No wheezing.  Musculoskeletal:        General: No swelling. Normal range of motion.     Cervical back:  Neck supple.  Lymphadenopathy:     Cervical: No cervical adenopathy.  Skin:    General: Skin is warm and dry.     Findings: No rash.  Neurological:     Mental Status: He is alert and oriented to person, place, and time.     Coordination: Coordination normal.  Psychiatric:        Behavior: Behavior normal.      Assessment & Plan:   Problem List Items Addressed This Visit       Endocrine   Diabetes mellitus ([place]) - Primary     Genitourinary   BPH (benign prostatic hyperplasia)     Other   Hyperlipemia   Other Visit Diagnoses     Smokes with greater than 30 pack year history       Relevant Orders   DG Chest 2 View       Will do chest x-ray because of smoking history for screening.  A1c looks good at 5.9, continue with diet.  Blood pressure on the lower side, encouraged fluid intake, denies lightheadedness or dizziness.   Follow up plan: Return in about 3 months (around 10/23/2021), or if symptoms worsen or fail to improve, for Diabetes and hyperlipidemia and BPH.  Counseling provided for all of the vaccine components Orders Placed This Encounter  Procedures   DG Chest [place], MD [facility] 07/24/2021, 8:39 AM

## 2021-08-30 ENCOUNTER — Telehealth: Payer: Self-pay | Admitting: Family Medicine

## 2021-08-30 NOTE — Telephone Encounter (Signed)
Left message for patient to call back and schedule Medicare Annual Wellness Visit (AWV) to be completed by video or phone.   Last AWV: : 06/13/2020  Please schedule at anytime with [facility] Advisor.  45 minute appointment  Any questions, please contact me at [phone]

## 2021-10-14 ENCOUNTER — Telehealth: Payer: Self-pay

## 2021-10-14 ENCOUNTER — Ambulatory Visit (INDEPENDENT_AMBULATORY_CARE_PROVIDER_SITE_OTHER): Payer: Medicare Other

## 2021-10-14 VITALS — Ht 66.0 in | Wt 162.0 lb

## 2021-10-14 DIAGNOSIS — F17210 Nicotine dependence, cigarettes, uncomplicated: Secondary | ICD-10-CM

## 2021-10-14 DIAGNOSIS — Z0001 Encounter for general adult medical examination with abnormal findings: Secondary | ICD-10-CM

## 2021-10-14 DIAGNOSIS — Z0000 Encounter for general adult medical examination without abnormal findings: Secondary | ICD-10-CM

## 2021-10-14 NOTE — Progress Notes (Signed)
Subjective:   Colin Mitchell is a 70 y.o. male who presents for Medicare Annual/Subsequent preventive examination. Virtual Visit via Telephone Note  I connected with  Colin Mitchell on 10/14/21 at  9:00 AM EST by telephone and verified that I am speaking with the correct person using two identifiers.  Location: Patient: Home Provider: [facility] Persons participating in the virtual visit: patient/Nurse Health Advisor   I discussed the limitations, risks, security and privacy concerns of performing an evaluation and management service by telephone and the availability of in person appointments. The patient expressed understanding and agreed to proceed.  Interactive audio and video telecommunications were attempted between this nurse and patient, however failed, due to patient having technical difficulties OR patient did not have access to video capability.  We continued and completed visit with audio only.  Some vital signs may be absent or patient reported.   Chriss Driver, LPN  Review of Systems     Cardiac Risk Factors include: advanced age (>67men, >67 women);diabetes mellitus;dyslipidemia;male gender;smoking/ tobacco exposure;sedentary lifestyle     Objective:    Today's Vitals   10/14/21 0903  Weight: 162 lb (73.5 kg)  Height: 5\' 6"  (1.676 m)   Body mass index is 26.15 kg/m.  Advanced Directives 10/14/2021 06/13/2020 06/07/2019  Does Patient Have a Medical Advance Directive? No;Yes Yes No  Type of Paramedic of [place];Living will Out of facility DNR (pink MOST or yellow form) -  Does patient want to make changes to medical advance directive? - No - Patient declined -  Copy of [place] in Chart? Yes - validated most recent copy scanned in chart (See row information) - -  Would patient like information on creating a medical advance directive? No - Patient declined - No - Patient declined    Current Medications  (verified) Outpatient Encounter Medications as of 10/14/2021  Medication Sig   albuterol (VENTOLIN HFA) 108 (90 Base) MCG/ACT inhaler Inhale 2 puffs into the lungs every 6 (six) hours as needed for wheezing or shortness of breath.   azelastine (ASTELIN) 0.1 % nasal spray Place 2 sprays into both nostrils 2 (two) times daily. Use in each nostril as directed   clopidogrel (PLAVIX) 75 MG tablet Take 1 tablet (75 mg total) by mouth daily.   fluticasone (FLONASE) 50 MCG/ACT nasal spray Place 1 spray into both nostrils 2 (two) times daily as needed for allergies or rhinitis.   pravastatin (PRAVACHOL) 40 MG tablet Take 1 tablet (40 mg total) by mouth daily.   tamsulosin (FLOMAX) 0.4 MG CAPS capsule Take 1 capsule (0.4 mg total) by mouth 2 (two) times daily.   No facility-administered encounter medications on file as of 10/14/2021.    Allergies (verified) Sulfa antibiotics, Aspirin, Ibuprofen, and Penicillins   History: Past Medical History:  Diagnosis Date   Allergic rhinitis    Benign prostatic hypertrophy    CAD (coronary artery disease)    Cancer ([place]) 2006, 2011   SCCA of the [place]; S/P radiation therapy 06/09/05 thru 07/31/05; H/O recurrence in 2011 and S/P resection on 08/22/10.   Degenerative disc disease    Depression    H/O depression   Detached retina, left    H/O Detached retina of left eye with visual deficit   HLD (hyperlipidemia)    Seizures ([place])    H/O seizures secondary to alcohol withdrawal   Sickle cell anemia ([place])    only in his eyes   Throat cancer ([place])  Thrombocytopenia ([place])    H/O thrombocytopenia   Past Surgical History:  Procedure Laterality Date   PILONIDAL CYST EXCISION  remote   THROAT SURGERY  2015   Family History  Problem Relation Age of Onset   Cancer Mother        breast    Diabetes Mother    Hypertension Mother    Cancer Father    Cancer Brother        kidney   Social History   Socioeconomic History   Marital status: Divorced     Spouse name: Not on file   Number of children: 1   Years of education: Not on file   Highest education level: GED or equivalent  Occupational History   Occupation: retired  Tobacco Use   Smoking status: Every Day    Packs/day: 1.00    Years: 60.00    Pack years: 60.00    Types: Cigarettes   Smokeless tobacco: Never  Vaping Use   Vaping Use: Never used  Substance and Sexual Activity   Alcohol use: Yes    Alcohol/week: 4.0 standard drinks    Types: 4 Cans of beer per week    Comment: 4 cans per week   Drug use: Never   Sexual activity: Not Currently  Other Topics Concern   Not on file  Social History Narrative   Not on file   Social Determinants of Health   Financial Resource Strain: Low Risk    Difficulty of Paying Living Expenses: Not hard at all  Food Insecurity: No Food Insecurity   Worried About Charity fundraiser in the Last Year: Never true   [place] in the Last Year: Never true  Transportation Needs: No Transportation Needs   Lack of Transportation (Medical): No   Lack of Transportation (Non-Medical): No  Physical Activity: Insufficiently Active   Days of Exercise per Week: 3 days   Minutes of Exercise per Session: 30 min  Stress: No Stress Concern Present   Feeling of Stress : Not at all  Social Connections: Moderately Integrated   Frequency of Communication with Friends and Family: More than three times a week   Frequency of Social Gatherings with Friends and Family: More than three times a week   Attends Religious Services: More than 4 times per year   Active Member of [employer] or Organizations: Yes   Attends Music therapist: More than 4 times per year   Marital Status: Divorced    Tobacco Counseling Ready to quit: Not Answered Counseling given: Not Answered   Clinical Intake:  Pre-visit preparation completed: Yes  Pain : No/denies pain     BMI - recorded: 26.15 Nutritional Status: BMI 25 -29 Overweight Nutritional Risks:  None Diabetes: No (Pt states he is borderline.)  How often do you need to have someone help you when you read instructions, pamphlets, or other written materials from your doctor or pharmacy?: 1 - Never  Diabetic?No, pt states he is pre-diabetic. Does not take medications or check blood sugars.   Interpreter Needed?: No  Information entered by :: MJ Luvina Poirier, LPN   Activities of Daily Living In your present state of health, do you have any difficulty performing the following activities: 10/14/2021  Hearing? N  Vision? Y  Comment blindness in one eye  Difficulty concentrating or making decisions? N  Walking or climbing stairs? N  Dressing or bathing? N  Doing errands, shopping? N  Preparing Food and eating ? N  Using the Toilet? N  In the past six months, have you accidently leaked urine? N  Do you have problems with loss of bowel control? N  Managing your Medications? N  Managing your Finances? N  Housekeeping or managing your Housekeeping? N  Some recent data might be hidden    Patient Care Team: Dettinger, Fransisca Kaufmann, MD as PCP - General (Family Medicine)  Indicate any recent Medical Services you may have received from other than [facility] providers in the past year (date may be approximate).     Assessment:   This is a routine wellness examination for [employer].  Hearing/Vision screen Hearing Screening - Comments:: No hearing issues. Vision Screening - Comments:: Glasses. [facility] [place]. 03/15/2021.  Dietary issues and exercise activities discussed: Current Exercise Habits: Home exercise routine, Type of exercise: walking;stretching, Time (Minutes): 30, Frequency (Times/Week): 3, Weekly Exercise (Minutes/Week): 90, Intensity: Mild, Exercise limited by: cardiac condition(s);Other - see comments (blindness in one eye.)   Goals Addressed             This Visit's Progress    DIET - INCREASE WATER INTAKE   On track    Try to drink 6-8 glasses of water daily.        Depression Screen PHQ 2/9 Scores 10/14/2021 07/24/2021 04/17/2021 01/17/2021 06/13/2020 08/24/2019 06/07/2019  PHQ - 2 Score 0 0 0 0 0 0 0    Fall Risk Fall Risk  10/14/2021 07/24/2021 04/17/2021 01/17/2021 06/13/2020  Falls in the past year? 0 0 0 0 0  Number falls in past yr: 0 - - - -  Injury with Fall? 0 - - - -  Risk for fall due to : Impaired vision - - - -  Follow up Falls prevention discussed - - - -    FALL RISK PREVENTION PERTAINING TO THE HOME:  Any stairs in or around the home? No  If so, are there any without handrails? No  Home free of loose throw rugs in walkways, pet beds, electrical cords, etc? Yes  Adequate lighting in your home to reduce risk of falls? Yes   ASSISTIVE DEVICES UTILIZED TO PREVENT FALLS:  Life alert? Yes  Use of a cane, walker or w/c? No  Grab bars in the bathroom? No  Shower chair or bench in shower? No  Elevated toilet seat or a handicapped toilet? No   TIMED UP AND GO:  Was the test performed? No .  Phone visit.   Cognitive Function:     6CIT Screen 10/14/2021 06/13/2020 06/07/2019  What Year? 0 points 0 points 0 points  What month? 0 points 0 points 0 points  What time? 0 points 0 points 0 points  Count back from 20 0 points 0 points 0 points  Months in reverse 0 points 0 points 0 points  Repeat phrase 0 points 2 points 0 points  Total Score 0 2 0    Immunizations Immunization History  Administered Date(s) Administered   Moderna Sars-Covid-2 Vaccination 08/22/2020   PFIZER(Purple Top)SARS-COV-2 Vaccination 12/22/2019, 01/20/2020   Pneumococcal Conjugate-13 03/15/2018    TDAP status: Due, Education has been provided regarding the importance of this vaccine. Advised may receive this vaccine at local pharmacy or Health Dept. Aware to provide a copy of the vaccination record if obtained from local pharmacy or Health Dept. Verbalized acceptance and understanding.  Flu Vaccine status: Due, Education has been provided regarding the  importance of this vaccine. Advised may receive this vaccine at local pharmacy or  Health Dept. Aware to provide a copy of the vaccination record if obtained from local pharmacy or Health Dept. Verbalized acceptance and understanding.  Pneumococcal vaccine status: Due, Education has been provided regarding the importance of this vaccine. Advised may receive this vaccine at local pharmacy or Health Dept. Aware to provide a copy of the vaccination record if obtained from local pharmacy or Health Dept. Verbalized acceptance and understanding.  Covid-19 vaccine status: Completed vaccines  Qualifies for Shingles Vaccine? Yes   Zostavax completed No   Shingrix Completed?: No.    Education has been provided regarding the importance of this vaccine. Patient has been advised to call insurance company to determine out of pocket expense if they have not yet received this vaccine. Advised may also receive vaccine at local pharmacy or Health Dept. Verbalized acceptance and understanding.  Screening Tests Health Maintenance  Topic Date Due   Pneumonia Vaccine 25+ Years old (2 - PPSV23 if available, else PCV20) 03/16/2019   COLONOSCOPY (Pts 45-45yrs Insurance coverage will need to be confirmed)  08/28/2019   COVID-19 Vaccine (4 - Booster) 10/17/2020   TETANUS/TDAP  01/17/2022 (Originally 08/04/1970)   INFLUENZA VACCINE  01/24/2022 (Originally 05/27/2021)   Zoster Vaccines- Shingrix (1 of 2) 07/18/2022 (Originally 08/04/1970)   URINE MICROALBUMIN  01/17/2022   HEMOGLOBIN A1C  01/21/2022   OPHTHALMOLOGY EXAM  03/15/2022   FOOT EXAM  04/17/2022   Hepatitis C Screening  Completed   HPV VACCINES  Aged Out    Health Maintenance  Health Maintenance Due  Topic Date Due   Pneumonia Vaccine 27+ Years old (2 - PPSV23 if available, else PCV20) 03/16/2019   COLONOSCOPY (Pts 45-18yrs Insurance coverage will need to be confirmed)  08/28/2019   COVID-19 Vaccine (4 - Booster) 10/17/2020    Colorectal cancer  screening: Type of screening: Colonoscopy. Completed 08/27/2009. Repeat every 10 years  Lung Cancer Screening: (Low Dose CT Chest recommended if Age 65-80 years, 30 pack-year currently smoking OR have quit w/in 15years.) does qualify.   Lung Cancer Screening Referral: Order placed today.  Additional Screening:  Hepatitis C Screening: does qualify; Completed 01/17/2021  Vision Screening: Recommended annual ophthalmology exams for early detection of glaucoma and other disorders of the eye. Is the patient up to date with their annual eye exam?  Yes  Who is the provider or what is the name of the office in which the patient attends annual eye exams? [facility] If pt is not established with a provider, would they like to be referred to a provider to establish care? No .   Dental Screening: Recommended annual dental exams for proper oral hygiene  Community Resource Referral / Chronic Care Management: CRR required this visit?  No   CCM required this visit?  No      Plan:     I have personally reviewed and noted the following in the patients chart:   Medical and social history Use of alcohol, tobacco or illicit drugs  Current medications and supplements including opioid prescriptions. Patient is not currently taking opioid prescriptions. Functional ability and status Nutritional status Physical activity Advanced directives List of other physicians Hospitalizations, surgeries, and ER visits in previous 12 months Vitals Screenings to include cognitive, depression, and falls Referrals and appointments  In addition, I have reviewed and discussed with patient certain preventive protocols, quality metrics, and best practice recommendations. A written personalized care plan for preventive services as well as general preventive health recommendations were provided to patient.  Chriss Driver, LPN   [id number]   Nurse Notes: Pt states he is doing well. Request made for Rx  for shower chair and grab bars for pt's bathroom. Discussed shingrix, second pneumococcal, flu and tdap vaccines and how to obtain. Order placed for Lung Cancer Screening. Discussed colonoscopy and that it is overdue. Pt wants to wait to schedule.

## 2021-10-14 NOTE — Patient Instructions (Signed)
Colin Mitchell , Thank you for taking time to come for your Medicare Wellness Visit. I appreciate your ongoing commitment to your health goals. Please review the following plan we discussed and let me know if I can assist you in the future.   Screening recommendations/referrals: Colonoscopy: Due. Done 08/27/2009. Repeat in 10 years Call office when you are ready to schedule.  Recommended yearly ophthalmology/optometry visit for glaucoma screening and checkup Recommended yearly dental visit for hygiene and checkup  Vaccinations: Influenza vaccine: Declined. Pneumococcal vaccine: Done 03/15/2018 Second dose due. Tdap vaccine: Due Repeat in 10 years  Shingles vaccine: Shingrix discussed. Please contact your pharmacy for coverage information.     Covid-19: Done 12/22/2019, 01/20/2020 and 08/22/2020  Advanced directives: Copy in patient's chart.  Conditions/risks identified: Aim for 30 minutes of exercise or walking each day, drink 6-8 glasses of water and eat lots of fruits and vegetables. KEEP UP THE GOOD WORK!!   Next appointment: Follow up in one year for your annual wellness visit. 2023.  Preventive Care 12 Years and Older, Male  Preventive care refers to lifestyle choices and visits with your health care provider that can promote health and wellness. What does preventive care include? A yearly physical exam. This is also called an annual well check. Dental exams once or twice a year. Routine eye exams. Ask your health care provider how often you should have your eyes checked. Personal lifestyle choices, including: Daily care of your teeth and gums. Regular physical activity. Eating a healthy diet. Avoiding tobacco and drug use. Limiting alcohol use. Practicing safe sex. Taking low doses of aspirin every day. Taking vitamin and mineral supplements as recommended by your health care provider. What happens during an annual well check? The services and screenings done by your  health care provider during your annual well check will depend on your age, overall health, lifestyle risk factors, and family history of disease. Counseling  Your health care provider may ask you questions about your: Alcohol use. Tobacco use. Drug use. Emotional well-being. Home and relationship well-being. Sexual activity. Eating habits. History of falls. Memory and ability to understand (cognition). Work and work Statistician. Screening  You may have the following tests or measurements: Height, weight, and BMI. Blood pressure. Lipid and cholesterol levels. These may be checked every 5 years, or more frequently if you are over 23 years old. Skin check. Lung cancer screening. You may have this screening every year starting at age 4 if you have a 30-pack-year history of smoking and currently smoke or have quit within the past 15 years. Fecal occult blood test (FOBT) of the stool. You may have this test every year starting at age 49. Flexible sigmoidoscopy or colonoscopy. You may have a sigmoidoscopy every 5 years or a colonoscopy every 10 years starting at age 32. Prostate cancer screening. Recommendations will vary depending on your family history and other risks. Hepatitis C blood test. Hepatitis B blood test. Sexually transmitted disease (STD) testing. Diabetes screening. This is done by checking your blood sugar (glucose) after you have not eaten for a while (fasting). You may have this done every 1-3 years. Abdominal aortic aneurysm (AAA) screening. You may need this if you are a current or former smoker. Osteoporosis. You may be screened starting at age 34 if you are at high risk. Talk with your health care provider about your test results, treatment options, and if necessary, the need for more tests. Vaccines  Your health care provider may recommend certain vaccines, such  as: Influenza vaccine. This is recommended every year. Tetanus, diphtheria, and acellular pertussis  (Tdap, Td) vaccine. You may need a Td booster every 10 years. Zoster vaccine. You may need this after age 63. Pneumococcal 13-valent conjugate (PCV13) vaccine. One dose is recommended after age 2. Pneumococcal polysaccharide (PPSV23) vaccine. One dose is recommended after age 82. Talk to your health care provider about which screenings and vaccines you need and how often you need them. This information is not intended to replace advice given to you by your health care provider. Make sure you discuss any questions you have with your health care provider. Document Released: 11/09/2015 Document Revised: 07/02/2016 Document Reviewed: 08/14/2015 Elsevier Interactive Patient Education  2017 [place] Prevention in the Home Falls can cause injuries. They can happen to people of all ages. There are many things you can do to make your home safe and to help prevent falls. What can I do on the outside of my home? Regularly fix the edges of walkways and driveways and fix any cracks. Remove anything that might make you trip as you walk through a door, such as a raised step or threshold. Trim any bushes or trees on the path to your home. Use bright outdoor lighting. Clear any walking paths of anything that might make someone trip, such as rocks or tools. Regularly check to see if handrails are loose or broken. Make sure that both sides of any steps have handrails. Any raised decks and porches should have guardrails on the edges. Have any leaves, snow, or ice cleared regularly. Use sand or salt on walking paths during winter. Clean up any spills in your garage right away. This includes oil or grease spills. What can I do in the bathroom? Use night lights. Install grab bars by the toilet and in the tub and shower. Do not use towel bars as grab bars. Use non-skid mats or decals in the tub or shower. If you need to sit down in the shower, use a plastic, non-slip stool. Keep the floor dry. Clean  up any water that spills on the floor as soon as it happens. Remove soap buildup in the tub or shower regularly. Attach bath mats securely with double-sided non-slip rug tape. Do not have throw rugs and other things on the floor that can make you trip. What can I do in the bedroom? Use night lights. Make sure that you have a light by your bed that is easy to reach. Do not use any sheets or blankets that are too big for your bed. They should not hang down onto the floor. Have a firm chair that has side arms. You can use this for support while you get dressed. Do not have throw rugs and other things on the floor that can make you trip. What can I do in the kitchen? Clean up any spills right away. Avoid walking on wet floors. Keep items that you use a lot in easy-to-reach places. If you need to reach something above you, use a strong step stool that has a grab bar. Keep electrical cords out of the way. Do not use floor polish or wax that makes floors slippery. If you must use wax, use non-skid floor wax. Do not have throw rugs and other things on the floor that can make you trip. What can I do with my stairs? Do not leave any items on the stairs. Make sure that there are handrails on both sides of the stairs and use  them. Fix handrails that are broken or loose. Make sure that handrails are as long as the stairways. Check any carpeting to make sure that it is firmly attached to the stairs. Fix any carpet that is loose or worn. Avoid having throw rugs at the top or bottom of the stairs. If you do have throw rugs, attach them to the floor with carpet tape. Make sure that you have a light switch at the top of the stairs and the bottom of the stairs. If you do not have them, ask someone to add them for you. What else can I do to help prevent falls? Wear shoes that: Do not have high heels. Have rubber bottoms. Are comfortable and fit you well. Are closed at the toe. Do not wear sandals. If you  use a stepladder: Make sure that it is fully opened. Do not climb a closed stepladder. Make sure that both sides of the stepladder are locked into place. Ask someone to hold it for you, if possible. Clearly mark and make sure that you can see: Any grab bars or handrails. First and last steps. Where the edge of each step is. Use tools that help you move around (mobility aids) if they are needed. These include: Canes. Walkers. Scooters. Crutches. Turn on the lights when you go into a dark area. Replace any light bulbs as soon as they burn out. Set up your furniture so you have a clear path. Avoid moving your furniture around. If any of your floors are uneven, fix them. If there are any pets around you, be aware of where they are. Review your medicines with your doctor. Some medicines can make you feel dizzy. This can increase your chance of falling. Ask your doctor what other things that you can do to help prevent falls. This information is not intended to replace advice given to you by your health care provider. Make sure you discuss any questions you have with your health care provider. Document Released: 08/09/2009 Document Revised: 03/20/2016 Document Reviewed: 11/17/2014 Elsevier Interactive Patient Education  2017 [employer].

## 2021-10-14 NOTE — Telephone Encounter (Signed)
Called pt for AWV, discussed bathroom safety. Pt states he does not have grab bars in his bathroom, as he is a Teacher, English as a foreign language. Pt also states he does not does not have a shower chair but would like one. Can an Rx be written and sent to [facility] for these supplies, for the patient? Thank you!

## 2021-10-14 NOTE — Telephone Encounter (Signed)
We can do Rx for these but it would have to be in an appointment, the insurance usually requires documentation and an appointment for any medical supplies

## 2021-10-14 NOTE — Telephone Encounter (Signed)
lmtcb

## 2021-10-14 NOTE — Telephone Encounter (Signed)
PT R/c 

## 2021-10-16 NOTE — Telephone Encounter (Signed)
Pt has appt 10/30/20 in his notes to address at appt

## 2021-10-29 ENCOUNTER — Telehealth: Payer: Self-pay | Admitting: Family Medicine

## 2021-10-29 ENCOUNTER — Other Ambulatory Visit: Payer: Medicare Other

## 2021-10-29 DIAGNOSIS — E1169 Type 2 diabetes mellitus with other specified complication: Secondary | ICD-10-CM | POA: Diagnosis not present

## 2021-10-29 LAB — CBC WITH DIFFERENTIAL/PLATELET
Basophils Absolute: 0 10*3/uL (ref 0.0–0.2)
Basos: 1 %
EOS (ABSOLUTE): 0.2 10*3/uL (ref 0.0–0.4)
Eos: 6 %
Hematocrit: 40.1 % (ref 37.5–51.0)
Hemoglobin: 13.3 g/dL (ref 13.0–17.7)
Immature Grans (Abs): 0 10*3/uL (ref 0.0–0.1)
Immature Granulocytes: 0 %
Lymphocytes Absolute: 1 10*3/uL (ref 0.7–3.1)
Lymphs: 33 %
MCH: 26.8 pg (ref 26.6–33.0)
MCHC: 33.2 g/dL (ref 31.5–35.7)
MCV: 81 fL (ref 79–97)
Monocytes Absolute: 0.4 10*3/uL (ref 0.1–0.9)
Monocytes: 12 %
Neutrophils Absolute: 1.5 10*3/uL (ref 1.4–7.0)
Neutrophils: 48 %
Platelets: 184 10*3/uL (ref 150–450)
RBC: 4.97 x10E6/uL (ref 4.14–5.80)
RDW: 14.3 % (ref 11.6–15.4)
WBC: 3.2 10*3/uL — ABNORMAL LOW (ref 3.4–10.8)

## 2021-10-29 LAB — BAYER DCA HB A1C WAIVED: HB A1C (BAYER DCA - WAIVED): 5.8 % — ABNORMAL HIGH (ref 4.8–5.6)

## 2021-10-29 NOTE — Telephone Encounter (Signed)
Pt is on the lab schedule for today. Will place orders once he arrives.

## 2021-10-30 ENCOUNTER — Ambulatory Visit: Payer: Commercial Managed Care - HMO | Admitting: Family Medicine

## 2021-10-30 ENCOUNTER — Ambulatory Visit: Payer: Medicare Other | Admitting: Family Medicine

## 2021-10-30 ENCOUNTER — Encounter: Payer: Self-pay | Admitting: Family Medicine

## 2021-12-03 DIAGNOSIS — H3581 Retinal edema: Secondary | ICD-10-CM | POA: Diagnosis not present

## 2021-12-03 DIAGNOSIS — H4312 Vitreous hemorrhage, left eye: Secondary | ICD-10-CM | POA: Diagnosis not present

## 2021-12-03 DIAGNOSIS — H2513 Age-related nuclear cataract, bilateral: Secondary | ICD-10-CM | POA: Diagnosis not present

## 2021-12-03 DIAGNOSIS — H33052 Total retinal detachment, left eye: Secondary | ICD-10-CM | POA: Diagnosis not present

## 2021-12-03 DIAGNOSIS — H33191 Other retinoschisis and retinal cysts, right eye: Secondary | ICD-10-CM | POA: Diagnosis not present

## 2022-01-01 DIAGNOSIS — H3523 Other non-diabetic proliferative retinopathy, bilateral: Secondary | ICD-10-CM | POA: Diagnosis not present

## 2022-01-01 DIAGNOSIS — H25811 Combined forms of age-related cataract, right eye: Secondary | ICD-10-CM | POA: Diagnosis not present

## 2022-01-09 DIAGNOSIS — H2511 Age-related nuclear cataract, right eye: Secondary | ICD-10-CM | POA: Diagnosis not present

## 2022-01-21 ENCOUNTER — Other Ambulatory Visit: Payer: Self-pay | Admitting: Family Medicine

## 2022-01-21 DIAGNOSIS — N400 Enlarged prostate without lower urinary tract symptoms: Secondary | ICD-10-CM

## 2022-01-21 DIAGNOSIS — E782 Mixed hyperlipidemia: Secondary | ICD-10-CM

## 2022-02-21 ENCOUNTER — Telehealth: Payer: Self-pay | Admitting: Family Medicine

## 2022-02-21 DIAGNOSIS — J3089 Other allergic rhinitis: Secondary | ICD-10-CM

## 2022-02-21 DIAGNOSIS — R062 Wheezing: Secondary | ICD-10-CM

## 2022-02-21 DIAGNOSIS — N400 Enlarged prostate without lower urinary tract symptoms: Secondary | ICD-10-CM

## 2022-02-21 DIAGNOSIS — E782 Mixed hyperlipidemia: Secondary | ICD-10-CM

## 2022-02-21 NOTE — Telephone Encounter (Signed)
How many medications are we talking about and does the patient need a chronic disease management follow up since he had a 30 day refill completed initially? ?

## 2022-02-21 NOTE — Telephone Encounter (Signed)
Apt scheduled next available 03/31/2022 Dettinger. Can pt get a refill till then? ?

## 2022-02-21 NOTE — Telephone Encounter (Signed)
Dettinger. NTBS 30 days given 01/22/22 ?

## 2022-02-21 NOTE — Telephone Encounter (Signed)
Wants to know if covering provider can advise on this request since Dr Dettinger wont be back until next week. Says pt is completely out of his medicines. ?

## 2022-02-24 MED ORDER — ALBUTEROL SULFATE HFA 108 (90 BASE) MCG/ACT IN AERS: 2.0000 | INHALATION_SPRAY | Freq: Four times a day (QID) | RESPIRATORY_TRACT | 3 refills | Status: DC | PRN

## 2022-02-24 MED ORDER — TAMSULOSIN HCL 0.4 MG PO CAPS: 0.4000 mg | ORAL_CAPSULE | Freq: Two times a day (BID) | ORAL | 1 refills | Status: DC

## 2022-02-24 MED ORDER — FLUTICASONE PROPIONATE 50 MCG/ACT NA SUSP: 1.0000 | Freq: Two times a day (BID) | NASAL | 6 refills | Status: DC | PRN

## 2022-02-24 MED ORDER — PRAVASTATIN SODIUM 40 MG PO TABS: 40.0000 mg | ORAL_TABLET | Freq: Every day | ORAL | 1 refills | Status: DC

## 2022-02-24 MED ORDER — CLOPIDOGREL BISULFATE 75 MG PO TABS: 75.0000 mg | ORAL_TABLET | Freq: Every day | ORAL | 1 refills | Status: DC

## 2022-02-24 MED ORDER — AZELASTINE HCL 0.1 % NA SOLN: 2.0000 | Freq: Two times a day (BID) | NASAL | 2 refills | Status: DC

## 2022-02-24 NOTE — Telephone Encounter (Signed)
Sent refills

## 2022-02-24 NOTE — Addendum Note (Signed)
Addended by: Zannie Cove on: 02/24/2022 08:32 AM ? ? Modules accepted: Orders ? ?

## 2022-03-05 ENCOUNTER — Other Ambulatory Visit: Payer: Self-pay | Admitting: *Deleted

## 2022-03-05 DIAGNOSIS — E782 Mixed hyperlipidemia: Secondary | ICD-10-CM

## 2022-03-05 MED ORDER — PRAVASTATIN SODIUM 40 MG PO TABS: 40.0000 mg | ORAL_TABLET | Freq: Every day | ORAL | 0 refills | Status: DC

## 2022-03-27 ENCOUNTER — Telehealth: Payer: Self-pay | Admitting: Family Medicine

## 2022-03-27 DIAGNOSIS — E1169 Type 2 diabetes mellitus with other specified complication: Secondary | ICD-10-CM

## 2022-03-28 ENCOUNTER — Other Ambulatory Visit: Payer: Medicare Other

## 2022-03-28 DIAGNOSIS — E1169 Type 2 diabetes mellitus with other specified complication: Secondary | ICD-10-CM

## 2022-03-28 LAB — BAYER DCA HB A1C WAIVED: HB A1C (BAYER DCA - WAIVED): 6.1 % — ABNORMAL HIGH (ref 4.8–5.6)

## 2022-03-28 NOTE — Telephone Encounter (Signed)
Aware and verbalizes understanding.  

## 2022-03-28 NOTE — Telephone Encounter (Signed)
Placed lab orders for the patient 

## 2022-03-29 LAB — CMP14+EGFR
ALT: 16 IU/L (ref 0–44)
AST: 21 IU/L (ref 0–40)
Albumin/Globulin Ratio: 1.6 (ref 1.2–2.2)
Albumin: 4.4 g/dL (ref 3.8–4.8)
Alkaline Phosphatase: 60 IU/L (ref 44–121)
BUN/Creatinine Ratio: 11 (ref 10–24)
BUN: 11 mg/dL (ref 8–27)
Bilirubin Total: 0.4 mg/dL (ref 0.0–1.2)
CO2: 26 mmol/L (ref 20–29)
Calcium: 9.7 mg/dL (ref 8.6–10.2)
Chloride: 103 mmol/L (ref 96–106)
Creatinine, Ser: 0.97 mg/dL (ref 0.76–1.27)
Globulin, Total: 2.7 g/dL (ref 1.5–4.5)
Glucose: 133 mg/dL — ABNORMAL HIGH (ref 70–99)
Potassium: 4.3 mmol/L (ref 3.5–5.2)
Sodium: 143 mmol/L (ref 134–144)
Total Protein: 7.1 g/dL (ref 6.0–8.5)
eGFR: 84 mL/min/{1.73_m2} (ref >59)

## 2022-03-29 LAB — LIPID PANEL
Chol/HDL Ratio: 4.2 ratio (ref 0.0–5.0)
Cholesterol, Total: 150 mg/dL (ref 100–199)
HDL: 36 mg/dL — ABNORMAL LOW (ref >39)
LDL Chol Calc (NIH): 90 mg/dL (ref 0–99)
Triglycerides: 132 mg/dL (ref 0–149)
VLDL Cholesterol Cal: 24 mg/dL (ref 5–40)

## 2022-03-31 ENCOUNTER — Encounter: Payer: Self-pay | Admitting: Family Medicine

## 2022-03-31 ENCOUNTER — Ambulatory Visit (INDEPENDENT_AMBULATORY_CARE_PROVIDER_SITE_OTHER): Payer: Medicare Other | Admitting: Family Medicine

## 2022-03-31 VITALS — BP 99/67 | HR 74 | Temp 98.2°F | Ht 66.0 in | Wt 157.0 lb

## 2022-03-31 DIAGNOSIS — E782 Mixed hyperlipidemia: Secondary | ICD-10-CM | POA: Diagnosis not present

## 2022-03-31 DIAGNOSIS — N400 Enlarged prostate without lower urinary tract symptoms: Secondary | ICD-10-CM

## 2022-03-31 DIAGNOSIS — E1169 Type 2 diabetes mellitus with other specified complication: Secondary | ICD-10-CM | POA: Diagnosis not present

## 2022-03-31 MED ORDER — PRAVASTATIN SODIUM 40 MG PO TABS: 40.0000 mg | ORAL_TABLET | Freq: Every day | ORAL | 1 refills | Status: DC

## 2022-03-31 MED ORDER — TAMSULOSIN HCL 0.4 MG PO CAPS: 0.4000 mg | ORAL_CAPSULE | Freq: Two times a day (BID) | ORAL | 1 refills | Status: DC

## 2022-03-31 MED ORDER — CLOPIDOGREL BISULFATE 75 MG PO TABS: 75.0000 mg | ORAL_TABLET | Freq: Every day | ORAL | 1 refills | Status: DC

## 2022-03-31 NOTE — Progress Notes (Signed)
BP 99/67   Pulse 74   Temp 98.2 F (36.8 C)   Ht '5\' 6"'  (1.676 m)   Wt 157 lb (71.2 kg)   SpO2 96%   BMI 25.34 kg/m    Subjective:   Patient ID: Colin Mitchell, male    DOB: 1951/02/15, 71 y.o.   MRN: [id number]  HPI: Colin Mitchell is a 71 y.o. male presenting on 03/31/2022 for Medical Management of Chronic Issues and Hyperlipidemia   HPI Type 2 diabetes mellitus Patient comes in today for recheck of his diabetes. Patient has been currently taking no medicine, has been diet controlled, A1c today is 6.1 so looks good.. Patient is not currently on an ACE inhibitor/ARB. Patient has not seen an ophthalmologist this year. Patient denies any issues with their feet. 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 Medication: Flomax Last PSA: Over a year ago, will do with next labs  Hyperlipidemia Patient is coming in for recheck of his hyperlipidemia. The patient is currently taking pravastatin. They deny any issues with myalgias or history of liver damage from it. They deny any focal numbness or weakness or chest pain.   Relevant past medical, surgical, family and social history reviewed and updated as indicated. Interim medical history since our last visit reviewed. Allergies and medications reviewed and updated.  Review of Systems  Constitutional:  Negative for chills and fever.  Eyes:  Negative for visual disturbance.  Respiratory:  Negative for shortness of breath and wheezing.   Cardiovascular:  Negative for chest pain and leg swelling.  Musculoskeletal:  Negative for back pain and gait problem.  Skin:  Negative for rash.  Neurological:  Negative for dizziness, weakness and light-headedness.  All other systems reviewed and are negative.  Per HPI unless specifically indicated above   Allergies as of 03/31/2022        Reactions   Sulfa Antibiotics    Aspirin Rash   Ibuprofen Rash   Penicillins Rash        Medication List        Accurate as of March 31, 2022 11:41 AM. If you have any questions, ask your nurse or doctor.          albuterol 108 (90 Base) MCG/ACT inhaler Commonly known as: VENTOLIN HFA Inhale 2 puffs into the lungs every 6 (six) hours as needed for wheezing or shortness of breath.   azelastine 0.1 % nasal spray Commonly known as: ASTELIN Place 2 sprays into both nostrils 2 (two) times daily. Use in each nostril as directed   clopidogrel 75 MG tablet Commonly known as: PLAVIX Take 1 tablet (75 mg total) by mouth daily. What changed: additional instructions Changed by: Fransisca Kaufmann Orlando Thalmann, MD   fluticasone 50 MCG/ACT nasal spray Commonly known as: FLONASE Place 1 spray into both nostrils 2 (two) times daily as needed for allergies or rhinitis.   pravastatin 40 MG tablet Commonly known as: PRAVACHOL Take 1 tablet (40 mg total) by mouth daily.   tamsulosin 0.4 MG Caps capsule Commonly known as: FLOMAX Take 1 capsule (0.4 mg total) by mouth 2 (two) times daily. What changed: additional instructions Changed by: Fransisca Kaufmann Nayana Lenig, MD         Objective:   BP 99/67   Pulse 74   Temp 98.2 F (36.8 C)  Ht '5\' 6"'  (1.676 m)   Wt 157 lb (71.2 kg)   SpO2 96%   BMI 25.34 kg/m   Wt Readings from Last 3 Encounters:  03/31/22 157 lb (71.2 kg)  10/14/21 162 lb (73.5 kg)  07/24/21 162 lb (73.5 kg)    Physical Exam Vitals and nursing note reviewed.  Constitutional:      General: He is not in acute distress.    Appearance: He is well-developed. He is not diaphoretic.  Eyes:     General: No scleral icterus.    Conjunctiva/sclera: Conjunctivae normal.  Neck:     Thyroid: No thyromegaly.  Cardiovascular:     Rate and Rhythm: Normal rate and regular rhythm.     Heart sounds: Normal heart sounds. No murmur heard. Pulmonary:     Effort: Pulmonary effort is normal. No  respiratory distress.     Breath sounds: Normal breath sounds. No wheezing.  Musculoskeletal:        General: No swelling. Normal range of motion.     Cervical back: Neck supple.  Lymphadenopathy:     Cervical: No cervical adenopathy.  Skin:    General: Skin is warm and dry.     Findings: No rash.  Neurological:     Mental Status: He is alert and oriented to person, place, and time.     Coordination: Coordination normal.  Psychiatric:        Behavior: Behavior normal.    Results for orders placed or performed in visit on 03/28/22  Bayer DCA Hb A1c Waived  Result Value Ref Range   HB A1C (BAYER DCA - WAIVED) 6.1 (H) 4.8 - 5.6 %  Lipid panel  Result Value Ref Range   Cholesterol, Total 150 100 - 199 mg/dL   Triglycerides 132 0 - 149 mg/dL   HDL 36 (L) >39 mg/dL   VLDL Cholesterol Cal 24 5 - 40 mg/dL   LDL Chol Calc (NIH) 90 0 - 99 mg/dL   Chol/HDL Ratio 4.2 0.0 - 5.0 ratio  CMP14+EGFR  Result Value Ref Range   Glucose 133 (H) 70 - 99 mg/dL   BUN 11 8 - 27 mg/dL   Creatinine, Ser 0.97 0.76 - 1.27 mg/dL   eGFR 84 >59 mL/min/1.73   BUN/Creatinine Ratio 11 10 - 24   Sodium 143 134 - 144 mmol/L   Potassium 4.3 3.5 - 5.2 mmol/L   Chloride 103 96 - 106 mmol/L   CO2 26 20 - 29 mmol/L   Calcium 9.7 8.6 - 10.2 mg/dL   Total Protein 7.1 6.0 - 8.5 g/dL   Albumin 4.4 3.8 - 4.8 g/dL   Globulin, Total 2.7 1.5 - 4.5 g/dL   Albumin/Globulin Ratio 1.6 1.2 - 2.2   Bilirubin Total 0.4 0.0 - 1.2 mg/dL   Alkaline Phosphatase 60 44 - 121 IU/L   AST 21 0 - 40 IU/L   ALT 16 0 - 44 IU/L    Assessment & Plan:   Problem List Items Addressed This Visit       Endocrine   Diabetes mellitus ([place]) - Primary   Relevant Medications   pravastatin (PRAVACHOL) 40 MG tablet     Genitourinary   BPH (benign prostatic hyperplasia)   Relevant Medications   tamsulosin (FLOMAX) 0.4 MG CAPS capsule     Other   Hyperlipemia   Relevant Medications   pravastatin (PRAVACHOL) 40 MG tablet    Blood  pressure slightly low and he does not think the Flomax is helping  him so I told him to stop it for 2 weeks and see if his urination gets worse and see if his blood pressure improves. Follow up plan: Return in about 3 months (around 07/01/2022), or if symptoms worsen or fail to improve, for Diabetes and cholesterol recheck.  Counseling provided for all of the vaccine components No orders of the defined types were placed in this encounter.   Caryl Pina, MD [facility] 03/31/2022, 11:41 AM

## 2022-05-29 ENCOUNTER — Other Ambulatory Visit: Payer: Self-pay | Admitting: Family Medicine

## 2022-06-03 DIAGNOSIS — H3581 Retinal edema: Secondary | ICD-10-CM | POA: Diagnosis not present

## 2022-06-03 DIAGNOSIS — H31091 Other chorioretinal scars, right eye: Secondary | ICD-10-CM | POA: Diagnosis not present

## 2022-06-03 DIAGNOSIS — H33052 Total retinal detachment, left eye: Secondary | ICD-10-CM | POA: Diagnosis not present

## 2022-06-03 DIAGNOSIS — H33191 Other retinoschisis and retinal cysts, right eye: Secondary | ICD-10-CM | POA: Diagnosis not present

## 2022-06-24 IMAGING — DX DG CHEST 2V
2 series · 2 of 2 positions shown · non-contrast
Comparison: 07/31/2010

CLINICAL DATA: Smoker with greater than 30 pack-year history,
coronary artery disease, squamous cell carcinoma of the uvula post
radiation therapy

EXAM:
CHEST - 2 VIEW

[chest pa]
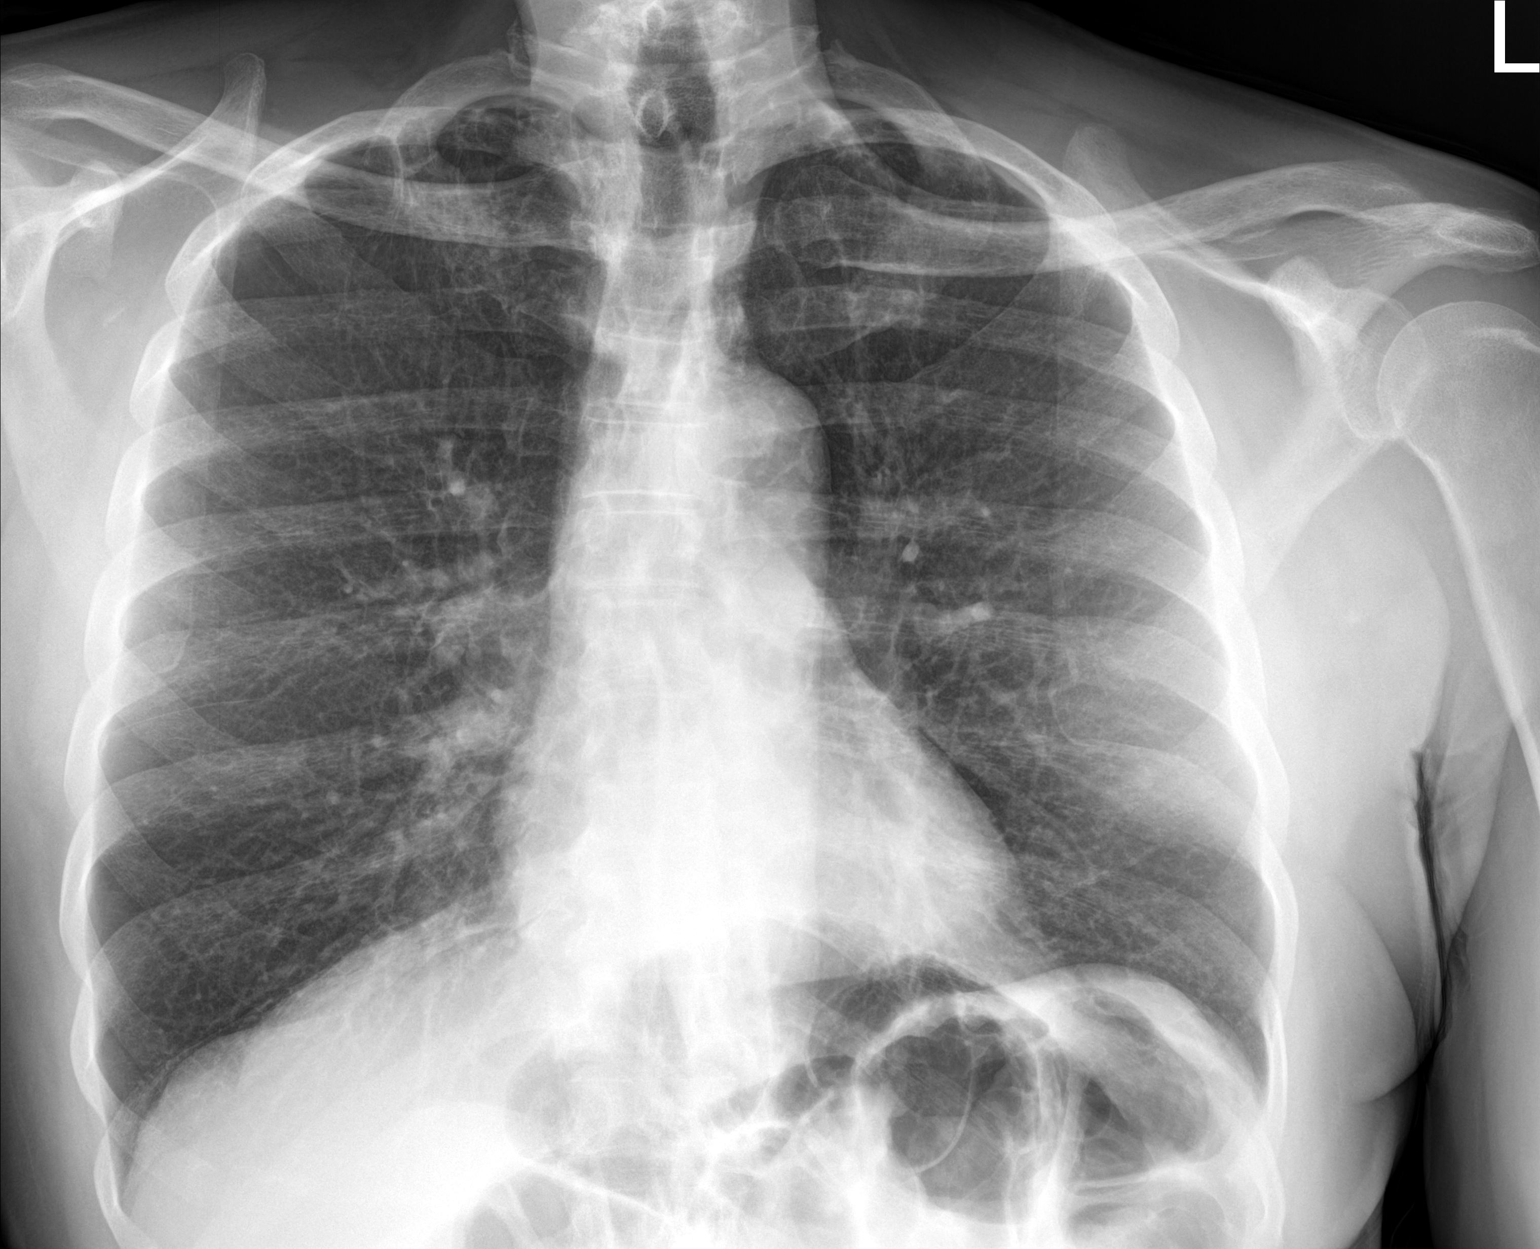

[chest lat]
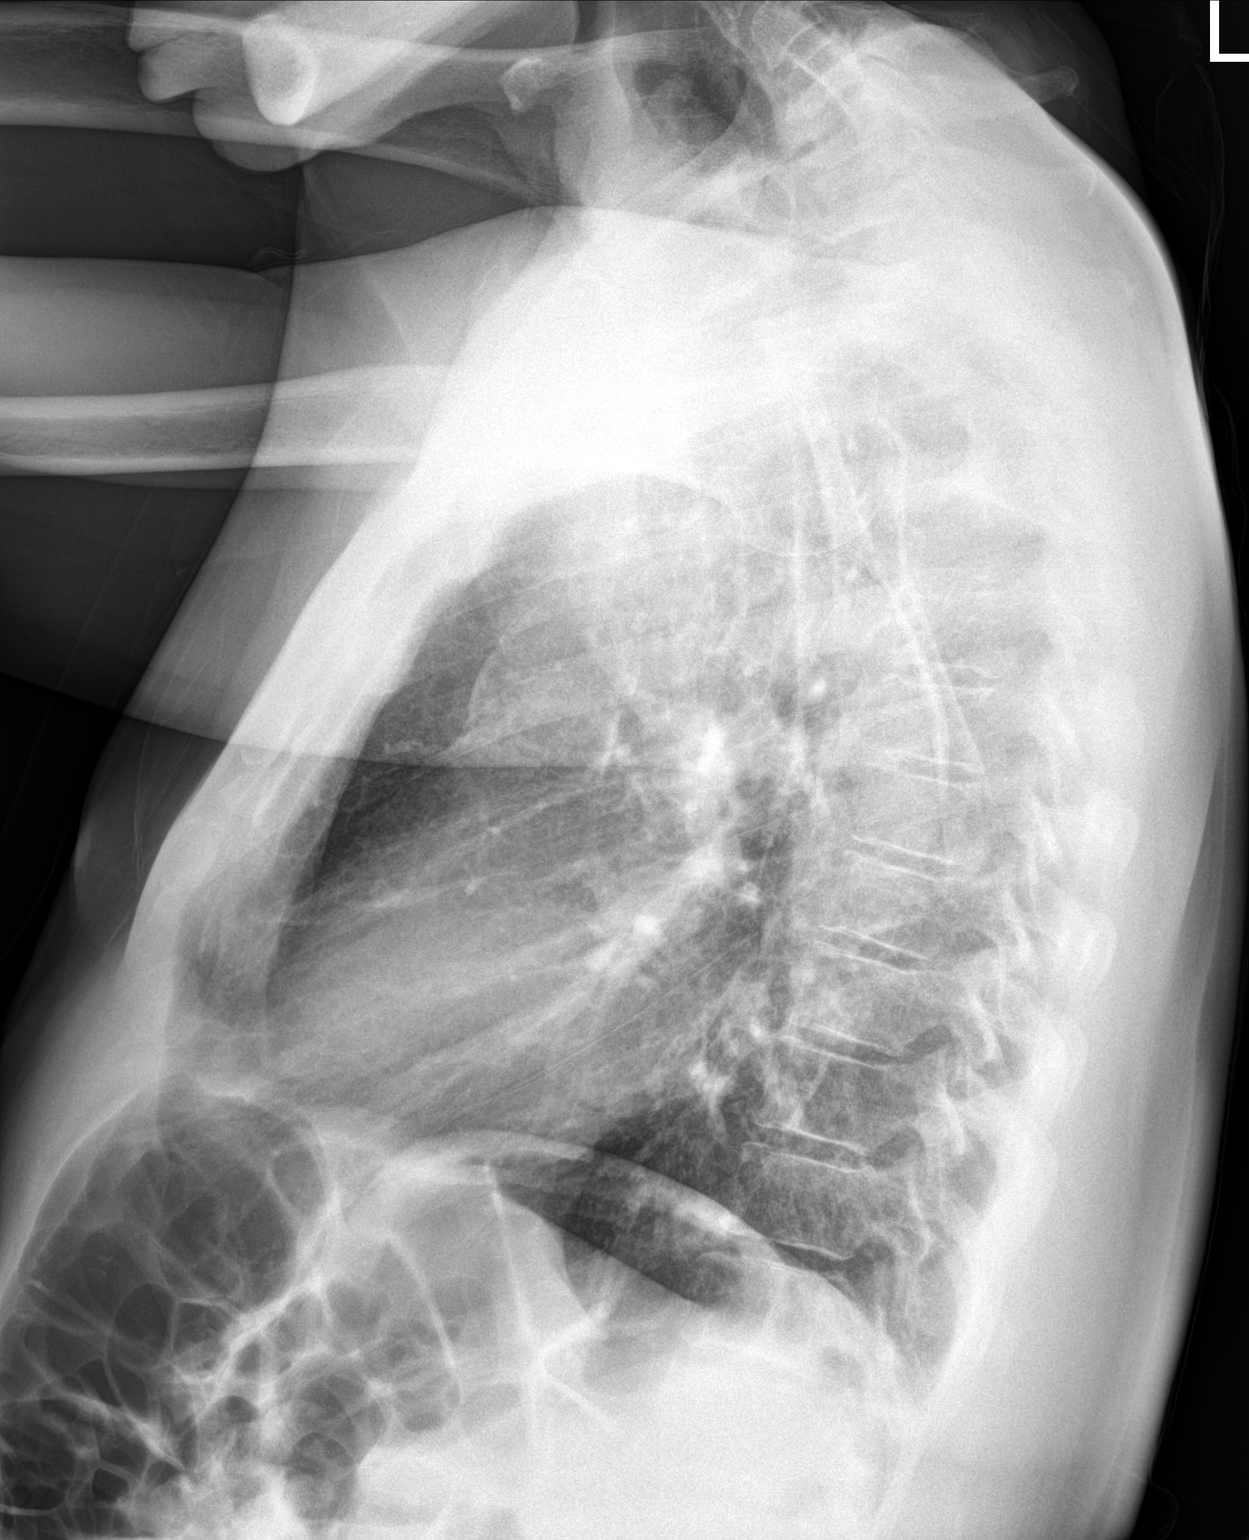

[2 of 2 positions shown; findings below may reference images not displayed]

FINDINGS: Normal heart size, mediastinal contours, and pulmonary vascularity.

Atherosclerotic calcification aorta.

Emphysematous and bronchitic changes consistent with COPD.

Mild bullous disease and scarring at RIGHT apex.

No acute infiltrate, pleural effusion, or pneumothorax.

Osseous structures unremarkable.
IMPRESSION: COPD changes with RIGHT apical scarring.

No acute abnormalities.

Aortic Atherosclerosis (41SW3-5NW.W) and Emphysema (41SW3-3K9.G).

## 2022-07-08 DIAGNOSIS — Z85819 Personal history of malignant neoplasm of unspecified site of lip, oral cavity, and pharynx: Secondary | ICD-10-CM | POA: Diagnosis not present

## 2022-07-09 ENCOUNTER — Ambulatory Visit: Payer: Medicare Other | Admitting: Family Medicine

## 2022-08-07 ENCOUNTER — Telehealth: Payer: Self-pay | Admitting: Family Medicine

## 2022-08-07 NOTE — Telephone Encounter (Signed)
Pt wants to come to do his labs tomorrow before his appt on Monday. Can someone add future lab order.

## 2022-08-08 ENCOUNTER — Other Ambulatory Visit: Payer: Medicare Other

## 2022-08-08 ENCOUNTER — Other Ambulatory Visit: Payer: Self-pay | Admitting: Family Medicine

## 2022-08-08 DIAGNOSIS — E782 Mixed hyperlipidemia: Secondary | ICD-10-CM

## 2022-08-08 DIAGNOSIS — E1169 Type 2 diabetes mellitus with other specified complication: Secondary | ICD-10-CM

## 2022-08-08 LAB — BAYER DCA HB A1C WAIVED: HB A1C (BAYER DCA - WAIVED): 5.9 % — ABNORMAL HIGH (ref 4.8–5.6)

## 2022-08-08 NOTE — Telephone Encounter (Signed)
Future orders placed 

## 2022-08-09 LAB — CMP14+EGFR
ALT: 9 IU/L (ref 0–44)
AST: 16 IU/L (ref 0–40)
Albumin/Globulin Ratio: 1.6 (ref 1.2–2.2)
Albumin: 4.4 g/dL (ref 3.8–4.8)
Alkaline Phosphatase: 61 IU/L (ref 44–121)
BUN/Creatinine Ratio: 15 (ref 10–24)
BUN: 15 mg/dL (ref 8–27)
Bilirubin Total: 0.3 mg/dL (ref 0.0–1.2)
CO2: 25 mmol/L (ref 20–29)
Calcium: 9.5 mg/dL (ref 8.6–10.2)
Chloride: 104 mmol/L (ref 96–106)
Creatinine, Ser: 1.01 mg/dL (ref 0.76–1.27)
Globulin, Total: 2.7 g/dL (ref 1.5–4.5)
Glucose: 109 mg/dL — ABNORMAL HIGH (ref 70–99)
Potassium: 4.2 mmol/L (ref 3.5–5.2)
Sodium: 143 mmol/L (ref 134–144)
Total Protein: 7.1 g/dL (ref 6.0–8.5)
eGFR: 80 mL/min/{1.73_m2} (ref >59)

## 2022-08-09 LAB — CBC WITH DIFFERENTIAL/PLATELET
Basophils Absolute: 0 10*3/uL (ref 0.0–0.2)
Basos: 1 %
EOS (ABSOLUTE): 0.2 10*3/uL (ref 0.0–0.4)
Eos: 6 %
Hematocrit: 42 % (ref 37.5–51.0)
Hemoglobin: 13.6 g/dL (ref 13.0–17.7)
Immature Grans (Abs): 0 10*3/uL (ref 0.0–0.1)
Immature Granulocytes: 0 %
Lymphocytes Absolute: 1 10*3/uL (ref 0.7–3.1)
Lymphs: 36 %
MCH: 27.5 pg (ref 26.6–33.0)
MCHC: 32.4 g/dL (ref 31.5–35.7)
MCV: 85 fL (ref 79–97)
Monocytes Absolute: 0.4 10*3/uL (ref 0.1–0.9)
Monocytes: 15 %
Neutrophils Absolute: 1.2 10*3/uL — ABNORMAL LOW (ref 1.4–7.0)
Neutrophils: 42 %
Platelets: 157 10*3/uL (ref 150–450)
RBC: 4.94 x10E6/uL (ref 4.14–5.80)
RDW: 14.3 % (ref 11.6–15.4)
WBC: 2.8 10*3/uL — ABNORMAL LOW (ref 3.4–10.8)

## 2022-08-09 LAB — LIPID PANEL
Chol/HDL Ratio: 4.4 ratio (ref 0.0–5.0)
Cholesterol, Total: 166 mg/dL (ref 100–199)
HDL: 38 mg/dL — ABNORMAL LOW (ref >39)
LDL Chol Calc (NIH): 110 mg/dL — ABNORMAL HIGH (ref 0–99)
Triglycerides: 95 mg/dL (ref 0–149)
VLDL Cholesterol Cal: 18 mg/dL (ref 5–40)

## 2022-08-11 ENCOUNTER — Ambulatory Visit: Payer: Medicare Other | Admitting: Family Medicine

## 2022-08-21 ENCOUNTER — Encounter: Payer: Self-pay | Admitting: Family Medicine

## 2022-08-21 ENCOUNTER — Ambulatory Visit (INDEPENDENT_AMBULATORY_CARE_PROVIDER_SITE_OTHER): Payer: Medicare Other | Admitting: Family Medicine

## 2022-08-21 VITALS — BP 106/72 | HR 81 | Temp 98.7°F | Ht 72.0 in | Wt 162.0 lb

## 2022-08-21 DIAGNOSIS — E782 Mixed hyperlipidemia: Secondary | ICD-10-CM

## 2022-08-21 DIAGNOSIS — E1169 Type 2 diabetes mellitus with other specified complication: Secondary | ICD-10-CM | POA: Diagnosis not present

## 2022-08-21 DIAGNOSIS — N400 Enlarged prostate without lower urinary tract symptoms: Secondary | ICD-10-CM | POA: Diagnosis not present

## 2022-08-21 DIAGNOSIS — E1149 Type 2 diabetes mellitus with other diabetic neurological complication: Secondary | ICD-10-CM | POA: Diagnosis not present

## 2022-08-21 DIAGNOSIS — Z23 Encounter for immunization: Secondary | ICD-10-CM | POA: Diagnosis not present

## 2022-08-21 MED ORDER — GABAPENTIN 100 MG PO CAPS: 100.0000 mg | ORAL_CAPSULE | Freq: Every day | ORAL | 1 refills | Status: DC

## 2022-08-21 MED ORDER — FINASTERIDE 5 MG PO TABS: 5.0000 mg | ORAL_TABLET | Freq: Every day | ORAL | 3 refills | Status: DC

## 2022-08-21 NOTE — Progress Notes (Signed)
BP 106/72   Pulse 81   Temp 98.7 F (37.1 C)   Ht 6' (1.829 m)   Wt 162 lb (73.5 kg)   SpO2 96%   BMI 21.97 kg/m    Subjective:   Patient ID: Colin Mitchell, male    DOB: December 11, 1950, 71 y.o.   MRN: [id number]  HPI: Colin Mitchell is a 71 y.o. male with a history of diabetes, BPH, tobacco use disorder, and pharyngeal cancer, presenting on 08/21/2022 for Medical Management of Chronic Issues  #Diabetes Mellitis  The patient has been following his diet changes as recommended, but is now reporting new symptoms of numbness, burning, and tingling in their feet. Most recent A1c was 6.1.   #BPH  Currently taking Tamsulosin. Reports ongoing frequency and urgency. Does not endorse orthostatic hypotension.     #Rhinorrhea  Ongoing nasal drainage since the patient had surgery for throat cancer  Reports the azelastine HCl has little to no effect.   #Hyperlipidemia  Currently compliant with pravastatin. No gastrointestinal upset or muscle aches.    #Peripheral Neuropathy Patient reports increased burning, numbness, and tingling in feet. Says that it is "hard to feel his feet on the floor." This has been worsening over the past few months. No recent wounds or cuts. No recent falls.  This has been going on for a long time but just not improving.  Relevant past medical, surgical, family and social history reviewed and updated as indicated. Interim medical history since our last visit reviewed. Allergies and medications reviewed and updated.       Review of Systems  Constitutional:  Negative for activity change, fatigue and fever.  HENT:  Positive for congestion. Negative for drooling, facial swelling and hearing loss.   Respiratory:  Negative for apnea, cough, chest tightness, shortness of breath and wheezing.   Cardiovascular:  Negative for chest pain, palpitations and leg swelling.  Gastrointestinal:  Negative for abdominal distention, abdominal pain, blood in stool, constipation,  diarrhea, nausea and vomiting.  Genitourinary:  Positive for frequency and urgency. Negative for difficulty urinating, dysuria, flank pain, hematuria and testicular pain.  Musculoskeletal:  Positive for back pain. Negative for arthralgias and myalgias.  Skin:  Negative for wound.  Neurological:  Negative for dizziness, light-headedness and headaches.  Hematological:  Does not bruise/bleed easily.    Per HPI unless specifically indicated above  Allergies as of 08/21/2022       Reactions   Sulfa Antibiotics    Aspirin Rash   Ibuprofen Rash   Penicillins Rash        Medication List        Accurate as of August 21, 2022  2:10 PM. If you have any questions, ask your nurse or doctor.          albuterol 108 (90 Base) MCG/ACT inhaler Commonly known as: VENTOLIN HFA Inhale 2 puffs into the lungs every 6 (six) hours as needed for wheezing or shortness of breath.   Azelastine HCl 137 MCG/SPRAY Soln 2 SPRAYS IN EACH NOSTRIL 2 TIMES A DAY   clopidogrel 75 MG tablet Commonly known as: PLAVIX Take 1 tablet (75 mg total) by mouth daily.   finasteride 5 MG tablet Commonly known as: Proscar Take 1 tablet (5 mg total) by mouth daily. Started by: Worthy Rancher, MD   fluticasone 50 MCG/ACT nasal spray Commonly known as: FLONASE Place 1 spray into both nostrils 2 (two) times daily as needed for allergies or rhinitis.   gabapentin 100 MG  capsule Commonly known as: NEURONTIN Take 1 capsule (100 mg total) by mouth at bedtime. Started by: Fransisca Kaufmann Jelina Paulsen, MD   pravastatin 40 MG tablet Commonly known as: PRAVACHOL Take 1 tablet (40 mg total) by mouth daily.   tamsulosin 0.4 MG Caps capsule Commonly known as: FLOMAX Take 1 capsule (0.4 mg total) by mouth 2 (two) times daily.         Objective:   BP 106/72   Pulse 81   Temp 98.7 F (37.1 C)   Ht 6' (1.829 m)   Wt 162 lb (73.5 kg)   SpO2 96%   BMI 21.97 kg/m   Wt Readings from Last 3 Encounters:  08/21/22  162 lb (73.5 kg)  03/31/22 157 lb (71.2 kg)  10/14/21 162 lb (73.5 kg)    Physical Exam Constitutional:      Appearance: Normal appearance.  HENT:     Nose: Rhinorrhea present. No congestion.  Cardiovascular:     Rate and Rhythm: Normal rate and regular rhythm.     Pulses:          Dorsalis pedis pulses are 1+ on the right side and 1+ on the left side.     Heart sounds: No murmur heard.    No friction rub.  Pulmonary:     Effort: Pulmonary effort is normal.     Breath sounds: Wheezing present.  Musculoskeletal:     Right foot: No deformity, bunion or Charcot foot.     Left foot: No deformity, bunion or Charcot foot.  Feet:     Right foot:     Protective Sensation: 5 sites tested.  5 sites sensed.     Skin integrity: Skin integrity normal.     Toenail Condition: Right toenails are normal.     Left foot:     Protective Sensation: 5 sites tested.  5 sites sensed.     Skin integrity: Skin integrity normal.     Toenail Condition: Left toenails are normal.  Neurological:     Mental Status: He is alert.     Results for orders placed or performed in visit on 08/08/22  Bayer DCA Hb A1c Waived  Result Value Ref Range   HB A1C (BAYER DCA - WAIVED) 5.9 (H) 4.8 - 5.6 %  Lipid panel  Result Value Ref Range   Cholesterol, Total 166 100 - 199 mg/dL   Triglycerides 95 0 - 149 mg/dL   HDL 38 (L) >39 mg/dL   VLDL Cholesterol Cal 18 5 - 40 mg/dL   LDL Chol Calc (NIH) 110 (H) 0 - 99 mg/dL   Chol/HDL Ratio 4.4 0.0 - 5.0 ratio  CBC with Differential/Platelet  Result Value Ref Range   WBC 2.8 (L) 3.4 - 10.8 x10E3/uL   RBC 4.94 4.14 - 5.80 x10E6/uL   Hemoglobin 13.6 13.0 - 17.7 g/dL   Hematocrit 42.0 37.5 - 51.0 %   MCV 85 79 - 97 fL   MCH 27.5 26.6 - 33.0 pg   MCHC 32.4 31.5 - 35.7 g/dL   RDW 14.3 11.6 - 15.4 %   Platelets 157 150 - 450 x10E3/uL   Neutrophils 42 Not Estab. %   Lymphs 36 Not Estab. %   Monocytes 15 Not Estab. %   Eos 6 Not Estab. %   Basos 1 Not Estab. %    Neutrophils Absolute 1.2 (L) 1.4 - 7.0 x10E3/uL   Lymphocytes Absolute 1.0 0.7 - 3.1 x10E3/uL   Monocytes Absolute 0.4 0.1 - 0.9  x10E3/uL   EOS (ABSOLUTE) 0.2 0.0 - 0.4 x10E3/uL   Basophils Absolute 0.0 0.0 - 0.2 x10E3/uL   Immature Granulocytes 0 Not Estab. %   Immature Grans (Abs) 0.0 0.0 - 0.1 x10E3/uL  CMP14+EGFR  Result Value Ref Range   Glucose 109 (H) 70 - 99 mg/dL   BUN 15 8 - 27 mg/dL   Creatinine, Ser 1.01 0.76 - 1.27 mg/dL   eGFR 80 >59 mL/min/1.73   BUN/Creatinine Ratio 15 10 - 24   Sodium 143 134 - 144 mmol/L   Potassium 4.2 3.5 - 5.2 mmol/L   Chloride 104 96 - 106 mmol/L   CO2 25 20 - 29 mmol/L   Calcium 9.5 8.6 - 10.2 mg/dL   Total Protein 7.1 6.0 - 8.5 g/dL   Albumin 4.4 3.8 - 4.8 g/dL   Globulin, Total 2.7 1.5 - 4.5 g/dL   Albumin/Globulin Ratio 1.6 1.2 - 2.2   Bilirubin Total 0.3 0.0 - 1.2 mg/dL   Alkaline Phosphatase 61 44 - 121 IU/L   AST 16 0 - 40 IU/L   ALT 9 0 - 44 IU/L    Assessment & Plan:   Problem List Items Addressed This Visit       Endocrine   Diabetes mellitus ([facility]) - Primary     Genitourinary   BPH (benign prostatic hyperplasia)   Relevant Medications   finasteride (PROSCAR) 5 MG tablet     Other   Hyperlipemia   Other Visit Diagnoses     Other diabetic neurological complication associated with type 2 diabetes mellitus ([facility])       Relevant Medications   finasteride (PROSCAR) 5 MG tablet   Other Relevant Orders   TSH (Completed)   Vitamin B12 (Completed)   Need for immunization against influenza       Relevant Orders   Flu Vaccine QUAD High Dose(Fluad) (Completed)        Follow up plan: Return in about 3 months (around 11/21/2022), or if symptoms worsen or fail to improve, for Diabetes recheck.  For the problem of the increased burning, tingling, and numbness in the lower extremities, highest on my differential is peripheral neuropathy secondary to diabetes. While peripheral arterial disease is also on the differential given  Colin Mitchell's ongoing hyperlipidemia, this is less likely given palpable pulses bilaterally, and perfusion of both extremities. No discoloration, wounds, or signs of acute hypoperfusion are present that to suggest Buerger's disease. I will order a recheck of the A1C and today to monitor possible progression of Colin Mitchell diabetes. Will also check a B12 level. Prescribed a low dose of gabapentin to hopefully improve some of the discomfort. Colin Mitchell reports that he has previously been on gabapentin and did not have any issues with this medication.   For his ongoing urinary frequency and urgency on tamsulosin, I prescribed finasteride. This medication will hopefully shrink the prostate to provide relief of his prostatic hyperplasia symptoms. I also counseled the patient on the possibility of surgery if he continues to be bothered by this problem after dual medication treatment.   Counseling provided for all of the vaccine components Orders Placed This Encounter  Procedures   Flu Vaccine QUAD High Dose(Fluad)   TSH   Vitamin B12    Stephani Police, MS3 Patient seen and examined with medical student.  Agree with assessment and plan above.  Present for the entirety of the exam and decision making.  We will start finasteride for BPH symptoms Caryl Pina, MD [facility] 08/22/2022, 1:37 PM

## 2022-08-22 LAB — TSH: TSH: 2.36 u[IU]/mL (ref 0.450–4.500)

## 2022-08-22 LAB — VITAMIN B12: Vitamin B-12: 936 pg/mL (ref 232–1245)

## 2022-09-06 ENCOUNTER — Other Ambulatory Visit: Payer: Self-pay | Admitting: Family Medicine

## 2022-10-06 NOTE — Progress Notes (Signed)
[facility] Quality Team Note  Name: BOWMAN HIGBIE Date of Birth: 1951-08-31 MRN: [id number] Date: 10/06/2022  [facility] Quality Team has reviewed this patient's chart, please see recommendations below:  [facility] Quality Other; (KED: Kidney Health Evaluation Gap- Patient needs Urine Albumin Creatinine Ratio Test completed for gap closure. EGFR has already been completed).

## 2022-10-15 ENCOUNTER — Other Ambulatory Visit: Payer: Self-pay | Admitting: Family Medicine

## 2022-10-15 DIAGNOSIS — J3089 Other allergic rhinitis: Secondary | ICD-10-CM

## 2022-10-28 ENCOUNTER — Other Ambulatory Visit: Payer: Self-pay | Admitting: Family Medicine

## 2022-10-28 DIAGNOSIS — N400 Enlarged prostate without lower urinary tract symptoms: Secondary | ICD-10-CM

## 2022-11-17 ENCOUNTER — Ambulatory Visit: Payer: 59 | Admitting: Family Medicine

## 2022-11-19 ENCOUNTER — Other Ambulatory Visit: Payer: Self-pay | Admitting: Family Medicine

## 2022-11-19 DIAGNOSIS — E782 Mixed hyperlipidemia: Secondary | ICD-10-CM

## 2022-12-18 ENCOUNTER — Other Ambulatory Visit: Payer: Self-pay | Admitting: Family Medicine

## 2022-12-18 DIAGNOSIS — R062 Wheezing: Secondary | ICD-10-CM

## 2022-12-19 ENCOUNTER — Ambulatory Visit (INDEPENDENT_AMBULATORY_CARE_PROVIDER_SITE_OTHER): Payer: 59

## 2022-12-19 VITALS — Ht 65.0 in | Wt 162.0 lb

## 2022-12-19 DIAGNOSIS — Z0000 Encounter for general adult medical examination without abnormal findings: Secondary | ICD-10-CM

## 2022-12-19 DIAGNOSIS — Z1211 Encounter for screening for malignant neoplasm of colon: Secondary | ICD-10-CM

## 2022-12-19 NOTE — Progress Notes (Signed)
Subjective:   Colin Mitchell is a 72 y.o. male who presents for Medicare Annual/Subsequent preventive examination. I connected with  Colin Mitchell on 12/19/22 by a audio enabled telemedicine application and verified that I am speaking with the correct person using two identifiers.  Patient Location: Home  Provider Location: Home Office  I discussed the limitations of evaluation and management by telemedicine. The patient expressed understanding and agreed to proceed.  Review of Systems     Cardiac Risk Factors include: advanced age (>34mn, >>93women);hypertension;diabetes mellitus;dyslipidemia;male gender     Objective:    Today's Vitals   12/19/22 1121  Weight: 162 lb (73.5 kg)  Height: '5\' 5"'$  (1.651 m)   Body mass index is 26.96 kg/m.     12/19/2022   11:25 AM 10/14/2021    9:09 AM 06/13/2020    9:43 AM 06/07/2019    9:58 AM  Advanced Directives  Does Patient Have a Medical Advance Directive? Yes No;Yes Yes No  Type of [place] Out of facility DNR (pink MOST or yellow form)   Does patient want to make changes to medical advance directive? No - Patient declined  No - Patient declined   Copy of [place] Chart? Yes - validated most recent copy scanned in chart (See row information) Yes - validated most recent copy scanned in chart (See row information)    Would patient like information on creating a medical advance directive?  No - Patient declined  No - Patient declined    Current Medications (verified) Outpatient Encounter Medications as of 12/19/2022  Medication Sig   albuterol (VENTOLIN HFA) 108 (90 Base) MCG/ACT inhaler 2 PUFFS EVERY 6 HOURS AS NEEDED FOR WHEEZING OR SHORTNESS OF BREATH   azelastine (ASTELIN) 0.1 % nasal spray 2 SPRAYS IN EACH NOSTRIL 2 TIMES A DAY   clopidogrel (PLAVIX) 75 MG tablet TAKE ONE TABLET BY MOUTH DAILY   finasteride  (PROSCAR) 5 MG tablet Take 1 tablet (5 mg total) by mouth daily.   fluticasone (FLONASE) 50 MCG/ACT nasal spray 1 SPRAY IN EACH NOSTRIL DAILY AS NEEDED FOR ALLERGIES   gabapentin (NEURONTIN) 100 MG capsule Take 1 capsule (100 mg total) by mouth at bedtime.   pravastatin (PRAVACHOL) 40 MG tablet TAKE ONE TABLET BY MOUTH DAILY   tamsulosin (FLOMAX) 0.4 MG CAPS capsule TAKE ONE CAPSULE BY MOUTH TWICE DAILY   No facility-administered encounter medications on file as of 12/19/2022.    Allergies (verified) Sulfa antibiotics, Aspirin, Ibuprofen, and Penicillins   History: Past Medical History:  Diagnosis Date   Allergic rhinitis    Benign prostatic hypertrophy    CAD (coronary artery disease)    Cancer ([place] 2006, 2011   SCCA of the [place]; S/P radiation therapy 06/09/05 thru 07/31/05; H/O recurrence in 2011 and S/P resection on 08/22/10.   Degenerative disc disease    Depression    H/O depression   Detached retina, left    H/O Detached retina of left eye with visual deficit   HLD (hyperlipidemia)    Seizures (HCC)    H/O seizures secondary to alcohol withdrawal   Sickle cell anemia (HCC)    only in his eyes   Throat cancer ([place]    Thrombocytopenia ([place]    H/O thrombocytopenia   Past Surgical History:  Procedure Laterality Date   PILONIDAL CYST EXCISION  remote   THROAT SURGERY  2015   Family History  Problem Relation Age of Onset   Cancer Mother        breast    Diabetes Mother    Hypertension Mother    Cancer Father    Cancer Brother        kidney   Social History   Socioeconomic History   Marital status: Divorced    Spouse name: Not on file   Number of children: 1   Years of education: Not on file   Highest education level: GED or equivalent  Occupational History   Occupation: retired  Tobacco Use   Smoking status: Every Day    Packs/day: 1.00    Years: 60.00    Total pack years: 60.00    Types: Cigarettes   Smokeless tobacco: Never  Vaping Use   Vaping  Use: Never used  Substance and Sexual Activity   Alcohol use: Yes    Alcohol/week: 4.0 standard drinks of alcohol    Types: 4 Cans of beer per week    Comment: 4 cans per week   Drug use: Never   Sexual activity: Not Currently  Other Topics Concern   Not on file  Social History Narrative   Not on file   Social Determinants of Health   Financial Resource Strain: Low Risk  (12/19/2022)   Overall Financial Resource Strain (CARDIA)    Difficulty of Paying Living Expenses: Not hard at all  Food Insecurity: No Food Insecurity (12/19/2022)   Hunger Vital Sign    Worried About Running Out of Food in the Last Year: Never true    Ran Out of Food in the Last Year: Never true  Transportation Needs: No Transportation Needs (12/19/2022)   PRAPARE - Hydrologist (Medical): No    Lack of Transportation (Non-Medical): No  Physical Activity: Sufficiently Active (12/19/2022)   Exercise Vital Sign    Days of Exercise per Week: 5 days    Minutes of Exercise per Session: 30 min  Stress: No Stress Concern Present (12/19/2022)   [place]    Feeling of Stress : Not at all  Social Connections: Socially Isolated (12/19/2022)   Social Connection and Isolation Panel [NHANES]    Frequency of Communication with Friends and Family: More than three times a week    Frequency of Social Gatherings with Friends and Family: More than three times a week    Attends Religious Services: Never    Marine scientist or Organizations: No    Attends Music therapist: Never    Marital Status: Divorced    Tobacco Counseling Ready to quit: No Counseling given: Not Answered   Clinical Intake:  Pre-visit preparation completed: Yes  Pain : No/denies pain     Nutritional Risks: None Diabetes: Yes CBG done?: No Did pt. bring in CBG monitor from home?: No  How often do you need to have someone help you  when you read instructions, pamphlets, or other written materials from your doctor or pharmacy?: 1 - Never  Diabetic?yes  Nutrition Risk Assessment:  Has the patient had any N/V/D within the last 2 months?  No  Does the patient have any non-healing wounds?  No  Has the patient had any unintentional weight loss or weight gain?  No   Diabetes:  Is the patient diabetic?  Yes  If diabetic, was a CBG obtained today?  No  Did the patient bring in their glucometer from home?  No  How often do you monitor your CBG's? Once a week .   Financial Strains and Diabetes Management:  Are you having any financial strains with the device, your supplies or your medication? No .  Does the patient want to be seen by Chronic Care Management for management of their diabetes?  No  Would the patient like to be referred to a Nutritionist or for Diabetic Management?  No   Diabetic Exams:  Diabetic Eye Exam: Completed 09/2023 Diabetic Foot Exam: Overdue, Pt has been advised about the importance in completing this exam. Pt is scheduled for diabetic foot exam on next office visit .   Interpreter Needed?: No  Information entered by :: Jadene Pierini, LPN   Activities of Daily Living    12/19/2022   11:25 AM  In your present state of health, do you have any difficulty performing the following activities:  Hearing? 0  Vision? 0  Difficulty concentrating or making decisions? 0  Walking or climbing stairs? 0  Dressing or bathing? 0  Doing errands, shopping? 0  Preparing Food and eating ? N  Using the Toilet? N  In the past six months, have you accidently leaked urine? N  Do you have problems with loss of bowel control? N  Managing your Medications? N  Managing your Finances? N  Housekeeping or managing your Housekeeping? N    Patient Care Team: Dettinger, Fransisca Kaufmann, MD as PCP - General (Family Medicine)  Indicate any recent Medical Services you may have received from other than [facility] providers in the  past year (date may be approximate).     Assessment:   This is a routine wellness examination for [employer].  Hearing/Vision screen Vision Screening - Comments:: Wears rx glasses - up to date with routine eye exams with  Dr.Groat   Dietary issues and exercise activities discussed: Current Exercise Habits: Home exercise routine, Type of exercise: walking, Time (Minutes): 30, Frequency (Times/Week): 5, Weekly Exercise (Minutes/Week): 150, Intensity: Mild, Exercise limited by: None identified   Goals Addressed             This Visit's Progress    DIET - INCREASE WATER INTAKE   On track    Try to drink 6-8 glasses of water daily.       Depression Screen    12/19/2022   11:24 AM 08/21/2022    1:02 PM 03/31/2022   11:09 AM 10/14/2021    9:07 AM 07/24/2021    8:01 AM 04/17/2021    8:08 AM 01/17/2021    8:00 AM  PHQ 2/9 Scores  PHQ - 2 Score 0 2 2 0 0 0 0  PHQ- 9 Score  5 7        Fall Risk    12/19/2022   11:23 AM 03/31/2022   11:09 AM 10/14/2021    9:10 AM 07/24/2021    8:01 AM 04/17/2021    8:08 AM  Fall Risk   Falls in the past year? 0 0 0 0 0  Number falls in past yr: 0  0    Injury with Fall? 0  0    Risk for fall due to : No Fall Risks  Impaired vision    Follow up Falls prevention discussed  Falls prevention discussed      FALL RISK PREVENTION PERTAINING TO THE HOME:  Any stairs in or around the home? No  If so, are there any without handrails? No  Home free of loose throw rugs in walkways, pet beds,  electrical cords, etc? Yes  Adequate lighting in your home to reduce risk of falls? Yes   ASSISTIVE DEVICES UTILIZED TO PREVENT FALLS:  Life alert? No  Use of a cane, walker or w/c? Yes  Grab bars in the bathroom? No  Shower chair or bench in shower? No  Elevated toilet seat or a handicapped toilet? No        12/19/2022   11:25 AM 10/14/2021    9:14 AM 06/13/2020    9:46 AM 06/07/2019   10:03 AM  6CIT Screen  What Year? 0 points 0 points 0 points 0 points   What month? 0 points 0 points 0 points 0 points  What time? 0 points 0 points 0 points 0 points  Count back from 20 0 points 0 points 0 points 0 points  Months in reverse 0 points 0 points 0 points 0 points  Repeat phrase 2 points 0 points 2 points 0 points  Total Score 2 points 0 points 2 points 0 points    Immunizations Immunization History  Administered Date(s) Administered   COVID-19, mRNA, vaccine(Comirnaty)12 years and older 08/05/2022   Fluad Quad(high Dose 65+) 08/21/2022   Moderna Sars-Covid-2 Vaccination 08/22/2020, 12/17/2021   PFIZER(Purple Top)SARS-COV-2 Vaccination 12/22/2019, 01/20/2020   Pneumococcal Conjugate-13 03/15/2018   Unspecified SARS-COV-2 Vaccination 08/05/2022    TDAP status: Due, Education has been provided regarding the importance of this vaccine. Advised may receive this vaccine at local pharmacy or Health Dept. Aware to provide a copy of the vaccination record if obtained from local pharmacy or Health Dept. Verbalized acceptance and understanding.  Flu Vaccine status: Up to date  Pneumococcal vaccine status: Due, Education has been provided regarding the importance of this vaccine. Advised may receive this vaccine at local pharmacy or Health Dept. Aware to provide a copy of the vaccination record if obtained from local pharmacy or Health Dept. Verbalized acceptance and understanding.  Covid-19 vaccine status: Completed vaccines  Qualifies for Shingles Vaccine? Yes   Zostavax completed No   Shingrix Completed?: No.    Education has been provided regarding the importance of this vaccine. Patient has been advised to call insurance company to determine out of pocket expense if they have not yet received this vaccine. Advised may also receive vaccine at local pharmacy or Health Dept. Verbalized acceptance and understanding.  Screening Tests Health Maintenance  Topic Date Due   DTaP/Tdap/Td (1 - Tdap) Never done   Zoster Vaccines- Shingrix (1 of 2) Never  done   Lung Cancer Screening  04/18/2006   Diabetic kidney evaluation - Urine ACR  01/17/2022   FOOT EXAM  04/17/2022   COVID-19 Vaccine (6 - 2023-24 season) 09/30/2022   Pneumonia Vaccine 38+ Years old (2 of 2 - PPSV23 or PCV20) 04/01/2023 (Originally 05/10/2018)   COLONOSCOPY (Pts 45-29yr Insurance coverage will need to be confirmed)  08/22/2023 (Originally 08/28/2019)   HEMOGLOBIN A1C  02/07/2023   Diabetic kidney evaluation - eGFR measurement  08/09/2023   Medicare Annual Wellness (AWV)  12/20/2023   INFLUENZA VACCINE  Completed   Hepatitis C Screening  Completed   HPV VACCINES  Aged Out   OPHTHALMOLOGY EXAM  Discontinued    Health Maintenance  Health Maintenance Due  Topic Date Due   DTaP/Tdap/Td (1 - Tdap) Never done   Zoster Vaccines- Shingrix (1 of 2) Never done   Lung Cancer Screening  04/18/2006   Diabetic kidney evaluation - Urine ACR  01/17/2022   FOOT EXAM  04/17/2022   COVID-19 Vaccine (  6 - 2023-24 season) 09/30/2022    Colorectal cancer screening: Referral to GI placed 12/19/2022. Pt aware the office will call re: appt.  Lung Cancer Screening: (Low Dose CT Chest recommended if Age 69-80 years, 30 pack-year currently smoking OR have quit w/in 15years.) does qualify.   Lung Cancer Screening Referral: declined   Additional Screening:  Hepatitis C Screening: does not qualify; Completed 01/17/2021  Vision Screening: Recommended annual ophthalmology exams for early detection of glaucoma and other disorders of the eye. Is the patient up to date with their annual eye exam?  Yes  Who is the provider or what is the name of the office in which the patient attends annual eye exams? Dr.Groat  If pt is not established with a provider, would they like to be referred to a provider to establish care? No .   Dental Screening: Recommended annual dental exams for proper oral hygiene  Community Resource Referral / Chronic Care Management: CRR required this visit?  No   CCM  required this visit?  No      Plan:     I have personally reviewed and noted the following in the patient's chart:   Medical and social history Use of alcohol, tobacco or illicit drugs  Current medications and supplements including opioid prescriptions. Patient is not currently taking opioid prescriptions. Functional ability and status Nutritional status Physical activity Advanced directives List of other physicians Hospitalizations, surgeries, and ER visits in previous 12 months Vitals Screenings to include cognitive, depression, and falls Referrals and appointments  In addition, I have reviewed and discussed with patient certain preventive protocols, quality metrics, and best practice recommendations. A written personalized care plan for preventive services as well as general preventive health recommendations were provided to patient.     Daphane Shepherd, LPN   [id number]   Nurse Notes: Due TDAP Everlean Alstrom Vaccine

## 2022-12-19 NOTE — Patient Instructions (Signed)
Colin Mitchell , Thank you for taking time to come for your Medicare Wellness Visit. I appreciate your ongoing commitment to your health goals. Please review the following plan we discussed and let me know if I can assist you in the future.   These are the goals we discussed:  Goals      DIET - INCREASE WATER INTAKE     Try to drink 6-8 glasses of water daily.     Patient Stated     06/13/2020 AWV Goal: Keep All Scheduled Appointments  Over the next year, patient will attend all scheduled appointments with their PCP and any specialists that they see.         This is a list of the screening recommended for you and due dates:  Health Maintenance  Topic Date Due   DTaP/Tdap/Td vaccine (1 - Tdap) Never done   Zoster (Shingles) Vaccine (1 of 2) Never done   Screening for Lung Cancer  04/18/2006   Yearly kidney health urinalysis for diabetes  01/17/2022   Complete foot exam   04/17/2022   COVID-19 Vaccine (6 - 2023-24 season) 09/30/2022   Pneumonia Vaccine (2 of 2 - PPSV23 or PCV20) 04/01/2023*   Colon Cancer Screening  08/22/2023*   Hemoglobin A1C  02/07/2023   Yearly kidney function blood test for diabetes  08/09/2023   Medicare Annual Wellness Visit  12/20/2023   Flu Shot  Completed   Hepatitis C Screening: USPSTF Recommendation to screen - Ages 18-79 yo.  Completed   HPV Vaccine  72 Aged Out   Eye exam for diabetics  Discontinued  *Topic was postponed. The date shown is not the original due date.    Advanced directives: [place] Chart   Conditions/risks identified: Aim for 30 minutes of exercise or brisk walking, 6-8 glasses of water, and 5 servings of fruits and vegetables each day.   Next appointment: Follow up in one year for your annual wellness visit.   Preventive Care 72 Years and Older, Male  Preventive care refers to lifestyle choices and visits with your health care provider that can promote health and wellness. What does preventive care include? A yearly physical exam.  This is also called an annual well check. Dental exams once or twice a year. Routine eye exams. Ask your health care provider how often you should have your eyes checked. Personal lifestyle choices, including: Daily care of your teeth and gums. Regular physical activity. Eating a healthy diet. Avoiding tobacco and drug use. Limiting alcohol use. Practicing safe sex. Taking low doses of aspirin every day. Taking vitamin and mineral supplements as recommended by your health care provider. What happens during an annual well check? The services and screenings done by your health care provider during your annual well check will depend on your age, overall health, lifestyle risk factors, and family history of disease. Counseling  Your health care provider may ask you questions about your: Alcohol use. Tobacco use. Drug use. Emotional well-being. Home and relationship well-being. Sexual activity. Eating habits. History of falls. Memory and ability to understand (cognition). Work and work Statistician. Screening  You may have the following tests or measurements: Height, weight, and BMI. Blood pressure. Lipid and cholesterol levels. These may be checked every 5 years, or more frequently if you are over 55 years old. Skin check. Lung cancer screening. You may have this screening every year starting at age 56 if you have a 30-pack-year history of smoking and currently smoke or have quit within the past  15 years. Fecal occult blood test (FOBT) of the stool. You may have this test every year starting at age 75. Flexible sigmoidoscopy or colonoscopy. You may have a sigmoidoscopy every 5 years or a colonoscopy every 10 years starting at age 29. Prostate cancer screening. Recommendations will vary depending on your family history and other risks. Hepatitis C blood test. Hepatitis B blood test. Sexually transmitted disease (STD) testing. Diabetes screening. This is done by checking your blood  sugar (glucose) after you have not eaten for a while (fasting). You may have this done every 1-3 years. Abdominal aortic aneurysm (AAA) screening. You may need this if you are a current or former smoker. Osteoporosis. You may be screened starting at age 70 if you are at high risk. Talk with your health care provider about your test results, treatment options, and if necessary, the need for more tests. Vaccines  Your health care provider may recommend certain vaccines, such as: Influenza vaccine. This is recommended every year. Tetanus, diphtheria, and acellular pertussis (Tdap, Td) vaccine. You may need a Td booster every 10 years. Zoster vaccine. You may need this after age 72. Pneumococcal 13-valent conjugate (PCV13) vaccine. One dose is recommended after age 30. Pneumococcal polysaccharide (PPSV23) vaccine. One dose is recommended after age 16. Talk to your health care provider about which screenings and vaccines you need and how often you need them. This information is not intended to replace advice given to you by your health care provider. Make sure you discuss any questions you have with your health care provider. Document Released: 11/09/2015 Document Revised: 07/02/2016 Document Reviewed: 08/14/2015 Elsevier Interactive Patient Education  2017 [employer] Prevention in the Home Falls can cause injuries. They can happen to people of all ages. There are many things you can do to make your home safe and to help prevent falls. What can I do on the outside of my home? Regularly fix the edges of walkways and driveways and fix any cracks. Remove anything that might make you trip as you walk through a door, such as a raised step or threshold. Trim any bushes or trees on the path to your home. Use bright outdoor lighting. Clear any walking paths of anything that might make someone trip, such as rocks or tools. Regularly check to see if handrails are loose or broken. Make sure that  both sides of any steps have handrails. Any raised decks and porches should have guardrails on the edges. Have any leaves, snow, or ice cleared regularly. Use sand or salt on walking paths during winter. Clean up any spills in your garage right away. This includes oil or grease spills. What can I do in the bathroom? Use night lights. Install grab bars by the toilet and in the tub and shower. Do not use towel bars as grab bars. Use non-skid mats or decals in the tub or shower. If you need to sit down in the shower, use a plastic, non-slip stool. Keep the floor dry. Clean up any water that spills on the floor as soon as it happens. Remove soap buildup in the tub or shower regularly. Attach bath mats securely with double-sided non-slip rug tape. Do not have throw rugs and other things on the floor that can make you trip. What can I do in the bedroom? Use night lights. Make sure that you have a light by your bed that is easy to reach. Do not use any sheets or blankets that are too big for your  bed. They should not hang down onto the floor. Have a firm chair that has side arms. You can use this for support while you get dressed. Do not have throw rugs and other things on the floor that can make you trip. What can I do in the kitchen? Clean up any spills right away. Avoid walking on wet floors. Keep items that you use a lot in easy-to-reach places. If you need to reach something above you, use a strong step stool that has a grab bar. Keep electrical cords out of the way. Do not use floor polish or wax that makes floors slippery. If you must use wax, use non-skid floor wax. Do not have throw rugs and other things on the floor that can make you trip. What can I do with my stairs? Do not leave any items on the stairs. Make sure that there are handrails on both sides of the stairs and use them. Fix handrails that are broken or loose. Make sure that handrails are as long as the stairways. Check  any carpeting to make sure that it is firmly attached to the stairs. Fix any carpet that is loose or worn. Avoid having throw rugs at the top or bottom of the stairs. If you do have throw rugs, attach them to the floor with carpet tape. Make sure that you have a light switch at the top of the stairs and the bottom of the stairs. If you do not have them, ask someone to add them for you. What else can I do to help prevent falls? Wear shoes that: Do not have high heels. Have rubber bottoms. Are comfortable and fit you well. Are closed at the toe. Do not wear sandals. If you use a stepladder: Make sure that it is fully opened. Do not climb a closed stepladder. Make sure that both sides of the stepladder are locked into place. Ask someone to hold it for you, if possible. Clearly mark and make sure that you can see: Any grab bars or handrails. First and last steps. Where the edge of each step is. Use tools that help you move around (mobility aids) if they are needed. These include: Canes. Walkers. Scooters. Crutches. Turn on the lights when you go into a dark area. Replace any light bulbs as soon as they burn out. Set up your furniture so you have a clear path. Avoid moving your furniture around. If any of your floors are uneven, fix them. If there are any pets around you, be aware of where they are. Review your medicines with your doctor. Some medicines can make you feel dizzy. This can increase your chance of falling. Ask your doctor what other things that you can do to help prevent falls. This information is not intended to replace advice given to you by your health care provider. Make sure you discuss any questions you have with your health care provider. Document Released: 08/09/2009 Document Revised: 03/20/2016 Document Reviewed: 11/17/2014 Elsevier Interactive Patient Education  2017 [employer].

## 2022-12-25 ENCOUNTER — Telehealth: Payer: Self-pay | Admitting: Family Medicine

## 2022-12-25 DIAGNOSIS — E1169 Type 2 diabetes mellitus with other specified complication: Secondary | ICD-10-CM

## 2022-12-25 DIAGNOSIS — N400 Enlarged prostate without lower urinary tract symptoms: Secondary | ICD-10-CM

## 2022-12-25 NOTE — Telephone Encounter (Signed)
Future orders placed, pt made aware.

## 2022-12-25 NOTE — Telephone Encounter (Signed)
Patient would like to come get lab orders on 3/1 so that they will be back for his appt on 3/4 - please add and call when they are added.

## 2022-12-26 ENCOUNTER — Other Ambulatory Visit: Payer: 59

## 2022-12-26 DIAGNOSIS — N400 Enlarged prostate without lower urinary tract symptoms: Secondary | ICD-10-CM

## 2022-12-26 DIAGNOSIS — E1169 Type 2 diabetes mellitus with other specified complication: Secondary | ICD-10-CM | POA: Diagnosis not present

## 2022-12-26 LAB — BAYER DCA HB A1C WAIVED: HB A1C (BAYER DCA - WAIVED): 6.6 % — ABNORMAL HIGH (ref 4.8–5.6)

## 2022-12-27 LAB — CMP14+EGFR
ALT: 12 IU/L (ref 0–44)
AST: 20 IU/L (ref 0–40)
Albumin/Globulin Ratio: 1.7 (ref 1.2–2.2)
Albumin: 4.6 g/dL (ref 3.8–4.8)
Alkaline Phosphatase: 61 IU/L (ref 44–121)
BUN/Creatinine Ratio: 10 (ref 10–24)
BUN: 11 mg/dL (ref 8–27)
Bilirubin Total: 0.3 mg/dL (ref 0.0–1.2)
CO2: 23 mmol/L (ref 20–29)
Calcium: 9.9 mg/dL (ref 8.6–10.2)
Chloride: 102 mmol/L (ref 96–106)
Creatinine, Ser: 1.12 mg/dL (ref 0.76–1.27)
Globulin, Total: 2.7 g/dL (ref 1.5–4.5)
Glucose: 119 mg/dL — ABNORMAL HIGH (ref 70–99)
Potassium: 4.5 mmol/L (ref 3.5–5.2)
Sodium: 142 mmol/L (ref 134–144)
Total Protein: 7.3 g/dL (ref 6.0–8.5)
eGFR: 70 mL/min/{1.73_m2} (ref >59)

## 2022-12-27 LAB — CBC WITH DIFFERENTIAL/PLATELET
Basophils Absolute: 0 10*3/uL (ref 0.0–0.2)
Basos: 1 %
EOS (ABSOLUTE): 0.1 10*3/uL (ref 0.0–0.4)
Eos: 3 %
Hematocrit: 46.1 % (ref 37.5–51.0)
Hemoglobin: 15.1 g/dL (ref 13.0–17.7)
Immature Grans (Abs): 0 10*3/uL (ref 0.0–0.1)
Immature Granulocytes: 0 %
Lymphocytes Absolute: 0.9 10*3/uL (ref 0.7–3.1)
Lymphs: 30 %
MCH: 27.9 pg (ref 26.6–33.0)
MCHC: 32.8 g/dL (ref 31.5–35.7)
MCV: 85 fL (ref 79–97)
Monocytes Absolute: 0.3 10*3/uL (ref 0.1–0.9)
Monocytes: 10 %
Neutrophils Absolute: 1.7 10*3/uL (ref 1.4–7.0)
Neutrophils: 56 %
Platelets: 167 10*3/uL (ref 150–450)
RBC: 5.41 x10E6/uL (ref 4.14–5.80)
RDW: 14.5 % (ref 11.6–15.4)
WBC: 3 10*3/uL — ABNORMAL LOW (ref 3.4–10.8)

## 2022-12-27 LAB — LIPID PANEL
Chol/HDL Ratio: 4.2 ratio (ref 0.0–5.0)
Cholesterol, Total: 163 mg/dL (ref 100–199)
HDL: 39 mg/dL — ABNORMAL LOW (ref >39)
LDL Chol Calc (NIH): 102 mg/dL — ABNORMAL HIGH (ref 0–99)
Triglycerides: 122 mg/dL (ref 0–149)
VLDL Cholesterol Cal: 22 mg/dL (ref 5–40)

## 2022-12-27 LAB — PSA, TOTAL AND FREE
PSA, Free Pct: 35 %
PSA, Free: 0.21 ng/mL
Prostate Specific Ag, Serum: 0.6 ng/mL (ref 0.0–4.0)

## 2022-12-29 ENCOUNTER — Ambulatory Visit (INDEPENDENT_AMBULATORY_CARE_PROVIDER_SITE_OTHER): Payer: 59 | Admitting: Family Medicine

## 2022-12-29 ENCOUNTER — Encounter: Payer: Self-pay | Admitting: Family Medicine

## 2022-12-29 VITALS — BP 106/73 | HR 80 | Ht 65.0 in | Wt 160.0 lb

## 2022-12-29 DIAGNOSIS — N400 Enlarged prostate without lower urinary tract symptoms: Secondary | ICD-10-CM

## 2022-12-29 DIAGNOSIS — Z1211 Encounter for screening for malignant neoplasm of colon: Secondary | ICD-10-CM

## 2022-12-29 DIAGNOSIS — E782 Mixed hyperlipidemia: Secondary | ICD-10-CM

## 2022-12-29 DIAGNOSIS — Z23 Encounter for immunization: Secondary | ICD-10-CM

## 2022-12-29 DIAGNOSIS — E1140 Type 2 diabetes mellitus with diabetic neuropathy, unspecified: Secondary | ICD-10-CM | POA: Diagnosis not present

## 2022-12-29 DIAGNOSIS — R062 Wheezing: Secondary | ICD-10-CM

## 2022-12-29 DIAGNOSIS — E1169 Type 2 diabetes mellitus with other specified complication: Secondary | ICD-10-CM | POA: Diagnosis not present

## 2022-12-29 DIAGNOSIS — Z716 Tobacco abuse counseling: Secondary | ICD-10-CM

## 2022-12-29 DIAGNOSIS — F17210 Nicotine dependence, cigarettes, uncomplicated: Secondary | ICD-10-CM | POA: Diagnosis not present

## 2022-12-29 MED ORDER — CLOPIDOGREL BISULFATE 75 MG PO TABS: 75.0000 mg | ORAL_TABLET | Freq: Every day | ORAL | 3 refills | Status: DC

## 2022-12-29 MED ORDER — PRAVASTATIN SODIUM 40 MG PO TABS: 40.0000 mg | ORAL_TABLET | Freq: Every day | ORAL | 3 refills | Status: DC

## 2022-12-29 MED ORDER — TAMSULOSIN HCL 0.4 MG PO CAPS: 0.4000 mg | ORAL_CAPSULE | Freq: Two times a day (BID) | ORAL | 0 refills | Status: DC

## 2022-12-29 MED ORDER — ALBUTEROL SULFATE HFA 108 (90 BASE) MCG/ACT IN AERS: INHALATION_SPRAY | RESPIRATORY_TRACT | 3 refills | Status: DC

## 2022-12-29 MED ORDER — GABAPENTIN 100 MG PO CAPS: 100.0000 mg | ORAL_CAPSULE | Freq: Every day | ORAL | 1 refills | Status: DC

## 2022-12-29 MED ORDER — VARENICLINE TARTRATE 1 MG PO TABS: 1.0000 mg | ORAL_TABLET | Freq: Two times a day (BID) | ORAL | 2 refills | Status: DC

## 2022-12-29 MED ORDER — VARENICLINE TARTRATE (STARTER) 0.5 MG X 11 & 1 MG X 42 PO TBPK: ORAL_TABLET | ORAL | 0 refills | Status: AC

## 2022-12-29 NOTE — Addendum Note (Signed)
Addended by: Alphonzo Dublin on: 12/29/2022 11:51 AM   Modules accepted: Orders

## 2022-12-29 NOTE — Progress Notes (Signed)
BP 106/73   Pulse 80   Ht '5\' 5"'$  (1.651 m)   Wt 160 lb (72.6 kg)   SpO2 96%   BMI 26.63 kg/m    Subjective:   Patient ID: Colin Mitchell, male    DOB: 11/11/1950, 72 y.o.   MRN: [id number]  HPI: Colin Mitchell is a 72 y.o. male presenting on 12/29/2022 for Medical Management of Chronic Issues, Hyperlipidemia, and Diabetes   HPI Type 2 diabetes mellitus Patient comes in today for recheck of his diabetes. Patient has been currently taking diet controlled. Patient is not currently on an ACE inhibitor/ARB. Patient has not seen an ophthalmologist this year. Patient denies any issues with their feet. The symptom started onset as an adult hyperlipidemia and BPH and neuropathy ARE RELATED TO DM   Hyperlipidemia Patient is coming in for recheck of his hyperlipidemia. The patient is currently taking pravastatin. They deny any issues with myalgias or history of liver damage from it. They deny any focal numbness or weakness or chest pain.   BPH Patient is coming in for recheck on BPH Symptoms: None currently Medication: Flomax and finasteride Last PSA: 0.6   Relevant past medical, surgical, family and social history reviewed and updated as indicated. Interim medical history since our last visit reviewed. Allergies and medications reviewed and updated.  Review of Systems  Constitutional:  Negative for chills and fever.  Eyes:  Negative for visual disturbance.  Respiratory:  Negative for shortness of breath and wheezing.   Cardiovascular:  Negative for chest pain and leg swelling.  Musculoskeletal:  Negative for back pain and gait problem.  Skin:  Negative for rash.  Neurological:  Negative for dizziness, weakness and light-headedness.  All other systems reviewed and are negative.   Per HPI unless specifically indicated above   Allergies as of 12/29/2022       Reactions   Sulfa Antibiotics    Aspirin Rash   Ibuprofen Rash   Penicillins Rash        Medication List         Accurate as of December 29, 2022 11:46 AM. If you have any questions, ask your nurse or doctor.          albuterol 108 (90 Base) MCG/ACT inhaler Commonly known as: VENTOLIN HFA 2 PUFFS EVERY 6 HOURS AS NEEDED FOR WHEEZING OR SHORTNESS OF BREATH   azelastine 0.1 % nasal spray Commonly known as: ASTELIN 2 SPRAYS IN EACH NOSTRIL 2 TIMES A DAY   clopidogrel 75 MG tablet Commonly known as: PLAVIX Take 1 tablet (75 mg total) by mouth daily.   finasteride 5 MG tablet Commonly known as: Proscar Take 1 tablet (5 mg total) by mouth daily.   fluticasone 50 MCG/ACT nasal spray Commonly known as: FLONASE 1 SPRAY IN EACH NOSTRIL DAILY AS NEEDED FOR ALLERGIES   gabapentin 100 MG capsule Commonly known as: NEURONTIN Take 1 capsule (100 mg total) by mouth at bedtime.   pravastatin 40 MG tablet Commonly known as: PRAVACHOL Take 1 tablet (40 mg total) by mouth daily.   tamsulosin 0.4 MG Caps capsule Commonly known as: FLOMAX Take 1 capsule (0.4 mg total) by mouth 2 (two) times daily.   Varenicline Tartrate (Starter) 0.5 MG X 11 & 1 MG X 42 Tbpk Commonly known as: Chantix Starting Month Pak Take 0.5 mg by mouth daily for 3 days, THEN 0.5 mg 2 (two) times daily for 4 days, THEN 1 mg 2 (two) times daily for 23 days. Start  taking on: December 29, 2022 Started by: Fransisca Kaufmann Brynlyn Dade, MD   varenicline 1 MG tablet Commonly known as: Chantix Continuing Month Pak Take 1 tablet (1 mg total) by mouth 2 (two) times daily. Started by: Fransisca Kaufmann Mainor Hellmann, MD         Objective:   BP 106/73   Pulse 80   Ht '5\' 5"'$  (1.651 m)   Wt 160 lb (72.6 kg)   SpO2 96%   BMI 26.63 kg/m   Wt Readings from Last 3 Encounters:  12/29/22 160 lb (72.6 kg)  12/19/22 162 lb (73.5 kg)  08/21/22 162 lb (73.5 kg)    Physical Exam Vitals and nursing note reviewed.  Constitutional:      General: He is not in acute distress.    Appearance: He is well-developed. He is not diaphoretic.  Eyes:     General: No  scleral icterus.    Conjunctiva/sclera: Conjunctivae normal.  Neck:     Thyroid: No thyromegaly.  Cardiovascular:     Rate and Rhythm: Normal rate and regular rhythm.     Heart sounds: Normal heart sounds. No murmur heard. Pulmonary:     Effort: Pulmonary effort is normal. No respiratory distress.     Breath sounds: Normal breath sounds. No wheezing.  Musculoskeletal:        General: No swelling. Normal range of motion.     Cervical back: Neck supple.  Lymphadenopathy:     Cervical: No cervical adenopathy.  Skin:    General: Skin is warm and dry.     Findings: No rash.  Neurological:     Mental Status: He is alert and oriented to person, place, and time.     Coordination: Coordination normal.  Psychiatric:        Behavior: Behavior normal.     Results for orders placed or performed in visit on 12/26/22  PSA, total and free  Result Value Ref Range   Prostate Specific Ag, Serum 0.6 0.0 - 4.0 ng/mL   PSA, Free 0.21 N/A ng/mL   PSA, Free Pct 35.0 %  Lipid panel  Result Value Ref Range   Cholesterol, Total 163 100 - 199 mg/dL   Triglycerides 122 0 - 149 mg/dL   HDL 39 (L) >39 mg/dL   VLDL Cholesterol Cal 22 5 - 40 mg/dL   LDL Chol Calc (NIH) 102 (H) 0 - 99 mg/dL   Chol/HDL Ratio 4.2 0.0 - 5.0 ratio  CMP14+EGFR  Result Value Ref Range   Glucose 119 (H) 70 - 99 mg/dL   BUN 11 8 - 27 mg/dL   Creatinine, Ser 1.12 0.76 - 1.27 mg/dL   eGFR 70 >59 mL/min/1.73   BUN/Creatinine Ratio 10 10 - 24   Sodium 142 134 - 144 mmol/L   Potassium 4.5 3.5 - 5.2 mmol/L   Chloride 102 96 - 106 mmol/L   CO2 23 20 - 29 mmol/L   Calcium 9.9 8.6 - 10.2 mg/dL   Total Protein 7.3 6.0 - 8.5 g/dL   Albumin 4.6 3.8 - 4.8 g/dL   Globulin, Total 2.7 1.5 - 4.5 g/dL   Albumin/Globulin Ratio 1.7 1.2 - 2.2   Bilirubin Total 0.3 0.0 - 1.2 mg/dL   Alkaline Phosphatase 61 44 - 121 IU/L   AST 20 0 - 40 IU/L   ALT 12 0 - 44 IU/L  CBC with Differential/Platelet  Result Value Ref Range   WBC 3.0 (L) 3.4  - 10.8 x10E3/uL   RBC 5.41 4.14 -  5.80 x10E6/uL   Hemoglobin 15.1 13.0 - 17.7 g/dL   Hematocrit 46.1 37.5 - 51.0 %   MCV 85 79 - 97 fL   MCH 27.9 26.6 - 33.0 pg   MCHC 32.8 31.5 - 35.7 g/dL   RDW 14.5 11.6 - 15.4 %   Platelets 167 150 - 450 x10E3/uL   Neutrophils 56 Not Estab. %   Lymphs 30 Not Estab. %   Monocytes 10 Not Estab. %   Eos 3 Not Estab. %   Basos 1 Not Estab. %   Neutrophils Absolute 1.7 1.4 - 7.0 x10E3/uL   Lymphocytes Absolute 0.9 0.7 - 3.1 x10E3/uL   Monocytes Absolute 0.3 0.1 - 0.9 x10E3/uL   EOS (ABSOLUTE) 0.1 0.0 - 0.4 x10E3/uL   Basophils Absolute 0.0 0.0 - 0.2 x10E3/uL   Immature Granulocytes 0 Not Estab. %   Immature Grans (Abs) 0.0 0.0 - 0.1 x10E3/uL  Bayer DCA Hb A1c Waived  Result Value Ref Range   HB A1C (BAYER DCA - WAIVED) 6.6 (H) 4.8 - 5.6 %    Assessment & Plan:   Problem List Items Addressed This Visit       Endocrine   Diabetes mellitus ([place]) - Primary   Relevant Medications   gabapentin (NEURONTIN) 100 MG capsule   pravastatin (PRAVACHOL) 40 MG tablet   Other Relevant Orders   Microalbumin / creatinine urine ratio     Genitourinary   BPH (benign prostatic hyperplasia)   Relevant Medications   tamsulosin (FLOMAX) 0.4 MG CAPS capsule     Other   Hyperlipemia   Relevant Medications   pravastatin (PRAVACHOL) 40 MG tablet   Other Visit Diagnoses     Wheezing       Relevant Medications   albuterol (VENTOLIN HFA) 108 (90 Base) MCG/ACT inhaler   Smokes with greater than 30 pack year history       Relevant Medications   Varenicline Tartrate, Starter, (CHANTIX STARTING MONTH PAK) 0.5 MG X 11 & 1 MG X 42 TBPK   varenicline (CHANTIX CONTINUING MONTH PAK) 1 MG tablet   Other Relevant Orders   CT CHEST LUNG CA SCREEN LOW DOSE W/O CM   Encounter for smoking cessation counseling       Relevant Medications   Varenicline Tartrate, Starter, (CHANTIX STARTING MONTH PAK) 0.5 MG X 11 & 1 MG X 42 TBPK   Colon cancer screening       Relevant  Orders   Ambulatory referral to Gastroenterology       Patient's wife quit smoking and he would like to quit smoking, he wants to try Chantix  Will refer for colon cancer screening and lung cancer screening.  Continue current medicine. Follow up plan: Return in about 3 months (around 03/31/2023), or if symptoms worsen or fail to improve, for Diabetes hyperlipidemia and BPH recheck.  Counseling provided for all of the vaccine components Orders Placed This Encounter  Procedures   CT CHEST LUNG CA SCREEN LOW DOSE W/O CM   Microalbumin / creatinine urine ratio   Ambulatory referral to Gastroenterology    Caryl Pina, MD [facility] 12/29/2022, 11:46 AM

## 2022-12-30 LAB — MICROALBUMIN / CREATININE URINE RATIO
Creatinine, Urine: 115.5 mg/dL
Microalb/Creat Ratio: 10 mg/g creat (ref 0–29)
Microalbumin, Urine: 11.2 ug/mL

## 2023-01-05 ENCOUNTER — Telehealth: Payer: Self-pay | Admitting: Family Medicine

## 2023-01-05 NOTE — Telephone Encounter (Signed)
Need clarification on directions for varenicline (CHANTIX CONTINUING MONTH PAK) 1 MG tablet   Please call back at [phone]

## 2023-01-05 NOTE — Telephone Encounter (Signed)
Spoke to pt's wife and reviewed instructions on taking the Chantix and pt's wife voiced understanding.

## 2023-03-02 DIAGNOSIS — H26491 Other secondary cataract, right eye: Secondary | ICD-10-CM | POA: Diagnosis not present

## 2023-03-02 DIAGNOSIS — H5213 Myopia, bilateral: Secondary | ICD-10-CM | POA: Diagnosis not present

## 2023-03-02 DIAGNOSIS — Z961 Presence of intraocular lens: Secondary | ICD-10-CM | POA: Diagnosis not present

## 2023-03-02 DIAGNOSIS — H3523 Other non-diabetic proliferative retinopathy, bilateral: Secondary | ICD-10-CM | POA: Diagnosis not present

## 2023-03-30 ENCOUNTER — Telehealth: Payer: Self-pay | Admitting: Family Medicine

## 2023-03-30 DIAGNOSIS — E1169 Type 2 diabetes mellitus with other specified complication: Secondary | ICD-10-CM

## 2023-03-30 NOTE — Telephone Encounter (Signed)
Future orders placed. Pt had labs in March so he is only due for A1C at his next visit.

## 2023-03-31 DIAGNOSIS — H524 Presbyopia: Secondary | ICD-10-CM | POA: Diagnosis not present

## 2023-04-02 ENCOUNTER — Other Ambulatory Visit: Payer: 59

## 2023-04-02 DIAGNOSIS — E1169 Type 2 diabetes mellitus with other specified complication: Secondary | ICD-10-CM

## 2023-04-02 LAB — BAYER DCA HB A1C WAIVED: HB A1C (BAYER DCA - WAIVED): 6.1 % — ABNORMAL HIGH (ref 4.8–5.6)

## 2023-04-03 ENCOUNTER — Encounter: Payer: Self-pay | Admitting: Family Medicine

## 2023-04-03 ENCOUNTER — Ambulatory Visit (INDEPENDENT_AMBULATORY_CARE_PROVIDER_SITE_OTHER): Payer: 59 | Admitting: Family Medicine

## 2023-04-03 VITALS — BP 149/78 | HR 65 | Ht 65.0 in | Wt 171.0 lb

## 2023-04-03 DIAGNOSIS — E1169 Type 2 diabetes mellitus with other specified complication: Secondary | ICD-10-CM

## 2023-04-03 DIAGNOSIS — N400 Enlarged prostate without lower urinary tract symptoms: Secondary | ICD-10-CM | POA: Diagnosis not present

## 2023-04-03 DIAGNOSIS — E782 Mixed hyperlipidemia: Secondary | ICD-10-CM

## 2023-04-03 DIAGNOSIS — E1149 Type 2 diabetes mellitus with other diabetic neurological complication: Secondary | ICD-10-CM | POA: Diagnosis not present

## 2023-04-03 MED ORDER — FINASTERIDE 5 MG PO TABS: 5.0000 mg | ORAL_TABLET | Freq: Every day | ORAL | 3 refills | Status: DC

## 2023-04-03 MED ORDER — TAMSULOSIN HCL 0.4 MG PO CAPS: 0.4000 mg | ORAL_CAPSULE | Freq: Two times a day (BID) | ORAL | 3 refills | Status: DC

## 2023-04-03 NOTE — Progress Notes (Signed)
BP (!) 149/78   Pulse 65   Ht 5\' 5"  (1.651 m)   Wt 171 lb (77.6 kg)   SpO2 95%   BMI 28.46 kg/m    Subjective:   Patient ID: Colin Mitchell, male    DOB: 05/12/1951, 72 y.o.   MRN: [id number]  HPI: Colin Mitchell is a 72 y.o. male presenting on 04/03/2023 for Medical Management of Chronic Issues and Diabetes   HPI Type 2 diabetes mellitus Patient comes in today for recheck of his diabetes. Patient has been currently taking no medicine currently, diet control. Patient is not currently on an ACE inhibitor/ARB. Patient has not seen an ophthalmologist this year. Patient denies any new issues with their feet. The symptom started onset as an adult hyperlipidemia ARE RELATED TO DM   Hyperlipidemia Patient is coming in for recheck of his hyperlipidemia. The patient is currently taking pravastatin. They deny any issues with myalgias or history of liver damage from it. They deny any focal numbness or weakness or chest pain.   BPH Patient is coming in for recheck on BPH Symptoms: Increased nocturia and difficulty urinating and starting stream. Medication: Finasteride and tamsulosin Last PSA: 0.6 on 12/26/2022  Relevant past medical, surgical, family and social history reviewed and updated as indicated. Interim medical history since our last visit reviewed. Allergies and medications reviewed and updated.  Review of Systems  Constitutional:  Negative for chills and fever.  Eyes:  Negative for visual disturbance.  Respiratory:  Negative for shortness of breath and wheezing.   Cardiovascular:  Negative for chest pain and leg swelling.  Musculoskeletal:  Negative for back pain and gait problem.  Skin:  Negative for rash.  Neurological:  Negative for dizziness, weakness and light-headedness.  All other systems reviewed and are negative.   Per HPI unless specifically indicated above   Allergies as of 04/03/2023       Reactions   Sulfa Antibiotics    Aspirin Rash   Ibuprofen Rash    Penicillins Rash        Medication List        Accurate as of April 03, 2023  9:00 AM. If you have any questions, ask your nurse or doctor.          albuterol 108 (90 Base) MCG/ACT inhaler Commonly known as: VENTOLIN HFA 2 PUFFS EVERY 6 HOURS AS NEEDED FOR WHEEZING OR SHORTNESS OF BREATH   azelastine 0.1 % nasal spray Commonly known as: ASTELIN 2 SPRAYS IN EACH NOSTRIL 2 TIMES A DAY   clopidogrel 75 MG tablet Commonly known as: PLAVIX Take 1 tablet (75 mg total) by mouth daily.   finasteride 5 MG tablet Commonly known as: Proscar Take 1 tablet (5 mg total) by mouth daily.   fluticasone 50 MCG/ACT nasal spray Commonly known as: FLONASE 1 SPRAY IN EACH NOSTRIL DAILY AS NEEDED FOR ALLERGIES   gabapentin 100 MG capsule Commonly known as: NEURONTIN Take 1 capsule (100 mg total) by mouth at bedtime.   pravastatin 40 MG tablet Commonly known as: PRAVACHOL Take 1 tablet (40 mg total) by mouth daily.   tamsulosin 0.4 MG Caps capsule Commonly known as: FLOMAX Take 1 capsule (0.4 mg total) by mouth 2 (two) times daily.   varenicline 1 MG tablet Commonly known as: Chantix Continuing Month Pak Take 1 tablet (1 mg total) by mouth 2 (two) times daily.         Objective:   BP (!) 149/78   Pulse 65  Ht 5\' 5"  (1.651 m)   Wt 171 lb (77.6 kg)   SpO2 95%   BMI 28.46 kg/m   Wt Readings from Last 3 Encounters:  04/03/23 171 lb (77.6 kg)  12/29/22 160 lb (72.6 kg)  12/19/22 162 lb (73.5 kg)    Physical Exam Vitals and nursing note reviewed.  Constitutional:      General: He is not in acute distress.    Appearance: He is well-developed. He is not diaphoretic.  Eyes:     General: No scleral icterus.    Conjunctiva/sclera: Conjunctivae normal.  Neck:     Thyroid: No thyromegaly.  Cardiovascular:     Rate and Rhythm: Normal rate and regular rhythm.     Heart sounds: Normal heart sounds. No murmur heard. Pulmonary:     Effort: Pulmonary effort is normal. No  respiratory distress.     Breath sounds: Normal breath sounds. No wheezing.  Musculoskeletal:        General: No swelling. Normal range of motion.     Cervical back: Neck supple.  Lymphadenopathy:     Cervical: No cervical adenopathy.  Skin:    General: Skin is warm and dry.     Findings: No rash.  Neurological:     Mental Status: He is alert and oriented to person, place, and time.     Coordination: Coordination normal.  Psychiatric:        Behavior: Behavior normal.     Results for orders placed or performed in visit on 04/02/23  Bayer DCA Hb A1c Waived  Result Value Ref Range   HB A1C (BAYER DCA - WAIVED) 6.1 (H) 4.8 - 5.6 %    Assessment & Plan:   Problem List Items Addressed This Visit       Endocrine   Diabetes mellitus ([facility]) - Primary     Genitourinary   BPH (benign prostatic hyperplasia)   Relevant Medications   tamsulosin (FLOMAX) 0.4 MG CAPS capsule   finasteride (PROSCAR) 5 MG tablet   Other Relevant Orders   Ambulatory referral to Urology     Other   Hyperlipemia   Other Visit Diagnoses     Other diabetic neurological complication associated with type 2 diabetes mellitus (HCC)       Relevant Medications   finasteride (PROSCAR) 5 MG tablet       A1c is 6.1, looks good.  Blood pressure up slightly but within range for him, allowing permissive hypertension  He did quit smoking recently which is good.  As increased difficulty with urination, will refer to urology.  Already on finasteride and tamsulosin Follow up plan: Return in about 3 months (around 07/04/2023), or if symptoms worsen or fail to improve, for Diabetes hypertension and hyperlipidemia.  Counseling provided for all of the vaccine components Orders Placed This Encounter  Procedures   Ambulatory referral to Urology    Arville Care, MD [facility] 04/03/2023, 9:00 AM

## 2023-05-25 ENCOUNTER — Telehealth: Payer: Self-pay | Admitting: Family Medicine

## 2023-05-25 ENCOUNTER — Other Ambulatory Visit: Payer: Self-pay | Admitting: Family Medicine

## 2023-05-25 DIAGNOSIS — F17210 Nicotine dependence, cigarettes, uncomplicated: Secondary | ICD-10-CM

## 2023-05-25 NOTE — Telephone Encounter (Signed)
[facility] is not in network with pts insurance per pts significant other.

## 2023-05-26 ENCOUNTER — Encounter (INDEPENDENT_AMBULATORY_CARE_PROVIDER_SITE_OTHER): Payer: Self-pay | Admitting: *Deleted

## 2023-05-26 NOTE — Telephone Encounter (Signed)
Referral was sent where Patient requested. Referral has been rerouted to [facility] on [address].

## 2023-06-23 DIAGNOSIS — Z85819 Personal history of malignant neoplasm of unspecified site of lip, oral cavity, and pharynx: Secondary | ICD-10-CM | POA: Diagnosis not present

## 2023-07-02 ENCOUNTER — Encounter (INDEPENDENT_AMBULATORY_CARE_PROVIDER_SITE_OTHER): Payer: Self-pay | Admitting: *Deleted

## 2023-07-06 ENCOUNTER — Telehealth: Payer: Self-pay | Admitting: Family Medicine

## 2023-07-06 DIAGNOSIS — E1169 Type 2 diabetes mellitus with other specified complication: Secondary | ICD-10-CM

## 2023-07-06 DIAGNOSIS — E782 Mixed hyperlipidemia: Secondary | ICD-10-CM

## 2023-07-06 NOTE — Telephone Encounter (Signed)
Future labs orders. Appt scheduled with lab 07/08/23 at 10:30. Pts family member made aware.

## 2023-07-08 ENCOUNTER — Other Ambulatory Visit: Payer: 59

## 2023-07-08 DIAGNOSIS — E1169 Type 2 diabetes mellitus with other specified complication: Secondary | ICD-10-CM | POA: Diagnosis not present

## 2023-07-08 DIAGNOSIS — E782 Mixed hyperlipidemia: Secondary | ICD-10-CM

## 2023-07-08 LAB — BAYER DCA HB A1C WAIVED: HB A1C (BAYER DCA - WAIVED): 7 % — ABNORMAL HIGH (ref 4.8–5.6)

## 2023-07-09 LAB — CBC WITH DIFFERENTIAL/PLATELET
Basophils Absolute: 0 10*3/uL (ref 0.0–0.2)
Basos: 1 %
EOS (ABSOLUTE): 0.2 10*3/uL (ref 0.0–0.4)
Eos: 5 %
Hematocrit: 38.5 % (ref 37.5–51.0)
Hemoglobin: 12.2 g/dL — ABNORMAL LOW (ref 13.0–17.7)
Immature Grans (Abs): 0 10*3/uL (ref 0.0–0.1)
Immature Granulocytes: 0 %
Lymphocytes Absolute: 1.1 10*3/uL (ref 0.7–3.1)
Lymphs: 31 %
MCH: 26.6 pg (ref 26.6–33.0)
MCHC: 31.7 g/dL (ref 31.5–35.7)
MCV: 84 fL (ref 79–97)
Monocytes Absolute: 0.4 10*3/uL (ref 0.1–0.9)
Monocytes: 12 %
Neutrophils Absolute: 1.9 10*3/uL (ref 1.4–7.0)
Neutrophils: 51 %
Platelets: 186 10*3/uL (ref 150–450)
RBC: 4.59 x10E6/uL (ref 4.14–5.80)
RDW: 15.1 % (ref 11.6–15.4)
WBC: 3.6 10*3/uL (ref 3.4–10.8)

## 2023-07-09 LAB — CMP14+EGFR
ALT: 19 IU/L (ref 0–44)
AST: 19 IU/L (ref 0–40)
Albumin: 4.4 g/dL (ref 3.8–4.8)
Alkaline Phosphatase: 61 IU/L (ref 44–121)
BUN/Creatinine Ratio: 14 (ref 10–24)
BUN: 13 mg/dL (ref 8–27)
Bilirubin Total: <0.2 mg/dL (ref 0.0–1.2)
CO2: 25 mmol/L (ref 20–29)
Calcium: 9.8 mg/dL (ref 8.6–10.2)
Chloride: 99 mmol/L (ref 96–106)
Creatinine, Ser: 0.95 mg/dL (ref 0.76–1.27)
Globulin, Total: 3 g/dL (ref 1.5–4.5)
Glucose: 167 mg/dL — ABNORMAL HIGH (ref 70–99)
Potassium: 4.3 mmol/L (ref 3.5–5.2)
Sodium: 142 mmol/L (ref 134–144)
Total Protein: 7.4 g/dL (ref 6.0–8.5)
eGFR: 86 mL/min/{1.73_m2} (ref >59)

## 2023-07-09 LAB — LIPID PANEL
Chol/HDL Ratio: 5.5 ratio — ABNORMAL HIGH (ref 0.0–5.0)
Cholesterol, Total: 181 mg/dL (ref 100–199)
HDL: 33 mg/dL — ABNORMAL LOW (ref >39)
LDL Chol Calc (NIH): 97 mg/dL (ref 0–99)
Triglycerides: 304 mg/dL — ABNORMAL HIGH (ref 0–149)
VLDL Cholesterol Cal: 51 mg/dL — ABNORMAL HIGH (ref 5–40)

## 2023-07-10 ENCOUNTER — Encounter: Payer: Self-pay | Admitting: Family Medicine

## 2023-07-10 ENCOUNTER — Ambulatory Visit (INDEPENDENT_AMBULATORY_CARE_PROVIDER_SITE_OTHER): Payer: 59 | Admitting: Family Medicine

## 2023-07-10 ENCOUNTER — Ambulatory Visit: Payer: 59

## 2023-07-10 VITALS — BP 97/71 | HR 87 | Ht 65.0 in | Wt 183.0 lb

## 2023-07-10 DIAGNOSIS — N400 Enlarged prostate without lower urinary tract symptoms: Secondary | ICD-10-CM

## 2023-07-10 DIAGNOSIS — E1169 Type 2 diabetes mellitus with other specified complication: Secondary | ICD-10-CM | POA: Diagnosis not present

## 2023-07-10 DIAGNOSIS — E1140 Type 2 diabetes mellitus with diabetic neuropathy, unspecified: Secondary | ICD-10-CM | POA: Diagnosis not present

## 2023-07-10 DIAGNOSIS — E782 Mixed hyperlipidemia: Secondary | ICD-10-CM | POA: Diagnosis not present

## 2023-07-10 MED ORDER — GABAPENTIN 100 MG PO CAPS
100.0000 mg | ORAL_CAPSULE | Freq: Every day | ORAL | 1 refills | Status: DC
Start: 2023-07-10 — End: 2024-01-11

## 2023-07-10 NOTE — Progress Notes (Signed)
BP 97/71   Pulse 87   Ht 5\' 5"  (1.651 m)   Wt 183 lb (83 kg)   SpO2 97%   BMI 30.45 kg/m    Subjective:   Patient ID: Colin Mitchell, male    DOB: [birth year], 72 y.o.   MRN: [id number]  HPI: Colin Mitchell is a 72 y.o. male presenting on 07/10/2023 for Medical Management of Chronic Issues, Diabetes, and Hyperlipidemia   HPI Type 2 diabetes mellitus Patient comes in today for recheck of his diabetes. Patient has been currently taking no medicine, diet control. Patient is not currently on an ACE inhibitor/ARB. Patient has not seen an ophthalmologist this year. Patient denies any new issues with their feet. The symptom started onset as an adult neuropathy and takes gabapentin and blindness of the left eye ARE RELATED TO DM   Hyperlipidemia Patient is coming in for recheck of his hyperlipidemia. The patient is currently taking pravastatin. They deny any issues with myalgias or history of liver damage from it. They deny any focal numbness or weakness or chest pain.   BPH Patient is coming in for recheck on BPH Symptoms: Frequency Medication: Finasteride and tamsulosin Last PSA: March 2024 0.6  Relevant past medical, surgical, family and social history reviewed and updated as indicated. Interim medical history since our last visit reviewed. Allergies and medications reviewed and updated.  Review of Systems  Constitutional:  Negative for chills and fever.  Eyes:  Negative for visual disturbance.  Respiratory:  Negative for shortness of breath and wheezing.   Cardiovascular:  Negative for chest pain and leg swelling.  Musculoskeletal:  Negative for back pain and gait problem.  Skin:  Negative for rash.  Neurological:  Negative for dizziness, weakness and light-headedness.  All other systems reviewed and are negative.   Per HPI unless specifically indicated above   Allergies as of 07/10/2023       Reactions   Sulfa Antibiotics    Aspirin Rash   Ibuprofen Rash    Penicillins Rash        Medication List        Accurate as of July 10, 2023  8:38 AM. If you have any questions, ask your nurse or doctor.          albuterol 108 (90 Base) MCG/ACT inhaler Commonly known as: VENTOLIN HFA 2 PUFFS EVERY 6 HOURS AS NEEDED FOR WHEEZING OR SHORTNESS OF BREATH   azelastine 0.1 % nasal spray Commonly known as: ASTELIN 2 SPRAYS IN EACH NOSTRIL 2 TIMES A DAY   clopidogrel 75 MG tablet Commonly known as: PLAVIX Take 1 tablet (75 mg total) by mouth daily.   finasteride 5 MG tablet Commonly known as: Proscar Take 1 tablet (5 mg total) by mouth daily.   fluticasone 50 MCG/ACT nasal spray Commonly known as: FLONASE 1 SPRAY IN EACH NOSTRIL DAILY AS NEEDED FOR ALLERGIES   gabapentin 100 MG capsule Commonly known as: NEURONTIN Take 1 capsule (100 mg total) by mouth at bedtime.   pravastatin 40 MG tablet Commonly known as: PRAVACHOL Take 1 tablet (40 mg total) by mouth daily.   tamsulosin 0.4 MG Caps capsule Commonly known as: FLOMAX Take 1 capsule (0.4 mg total) by mouth 2 (two) times daily.   varenicline 1 MG tablet Commonly known as: CHANTIX TAKE ONE TABLET TWICE DAILY         Objective:   BP 97/71   Pulse 87   Ht 5\' 5"  (1.651 m)   Wt 183  lb (83 kg)   SpO2 97%   BMI 30.45 kg/m   Wt Readings from Last 3 Encounters:  07/10/23 183 lb (83 kg)  04/03/23 171 lb (77.6 kg)  12/29/22 160 lb (72.6 kg)    Physical Exam Vitals and nursing note reviewed.  Constitutional:      General: He is not in acute distress.    Appearance: He is well-developed. He is not diaphoretic.  Eyes:     General: No scleral icterus.    Conjunctiva/sclera: Conjunctivae normal.  Neck:     Thyroid: No thyromegaly.  Cardiovascular:     Rate and Rhythm: Normal rate and regular rhythm.     Heart sounds: Normal heart sounds. No murmur heard. Pulmonary:     Effort: Pulmonary effort is normal. No respiratory distress.     Breath sounds: Normal breath  sounds. No wheezing.  Musculoskeletal:        General: No swelling. Normal range of motion.     Cervical back: Neck supple.  Lymphadenopathy:     Cervical: No cervical adenopathy.  Skin:    General: Skin is warm and dry.     Findings: No rash.  Neurological:     Mental Status: He is alert and oriented to person, place, and time.     Coordination: Coordination normal.  Psychiatric:        Behavior: Behavior normal.     Results for orders placed or performed in visit on 07/08/23  Bayer DCA Hb A1c Waived  Result Value Ref Range   HB A1C (BAYER DCA - WAIVED) 7.0 (H) 4.8 - 5.6 %  Lipid panel  Result Value Ref Range   Cholesterol, Total 181 100 - 199 mg/dL   Triglycerides 161 (H) 0 - 149 mg/dL   HDL 33 (L) >09 mg/dL   VLDL Cholesterol Cal 51 (H) 5 - 40 mg/dL   LDL Chol Calc (NIH) 97 0 - 99 mg/dL   Chol/HDL Ratio 5.5 (H) 0.0 - 5.0 ratio  CMP14+EGFR  Result Value Ref Range   Glucose 167 (H) 70 - 99 mg/dL   BUN 13 8 - 27 mg/dL   Creatinine, Ser 6.04 0.76 - 1.27 mg/dL   eGFR 86 >54 UJ/WJX/9.14   BUN/Creatinine Ratio 14 10 - 24   Sodium 142 134 - 144 mmol/L   Potassium 4.3 3.5 - 5.2 mmol/L   Chloride 99 96 - 106 mmol/L   CO2 25 20 - 29 mmol/L   Calcium 9.8 8.6 - 10.2 mg/dL   Total Protein 7.4 6.0 - 8.5 g/dL   Albumin 4.4 3.8 - 4.8 g/dL   Globulin, Total 3.0 1.5 - 4.5 g/dL   Bilirubin Total <7.8 0.0 - 1.2 mg/dL   Alkaline Phosphatase 61 44 - 121 IU/L   AST 19 0 - 40 IU/L   ALT 19 0 - 44 IU/L  CBC with Differential/Platelet  Result Value Ref Range   WBC 3.6 3.4 - 10.8 x10E3/uL   RBC 4.59 4.14 - 5.80 x10E6/uL   Hemoglobin 12.2 (L) 13.0 - 17.7 g/dL   Hematocrit 29.5 62.1 - 51.0 %   MCV 84 79 - 97 fL   MCH 26.6 26.6 - 33.0 pg   MCHC 31.7 31.5 - 35.7 g/dL   RDW 30.8 65.7 - 84.6 %   Platelets 186 150 - 450 x10E3/uL   Neutrophils 51 Not Estab. %   Lymphs 31 Not Estab. %   Monocytes 12 Not Estab. %   Eos 5 Not Estab. %  Basos 1 Not Estab. %   Neutrophils Absolute 1.9 1.4  - 7.0 x10E3/uL   Lymphocytes Absolute 1.1 0.7 - 3.1 x10E3/uL   Monocytes Absolute 0.4 0.1 - 0.9 x10E3/uL   EOS (ABSOLUTE) 0.2 0.0 - 0.4 x10E3/uL   Basophils Absolute 0.0 0.0 - 0.2 x10E3/uL   Immature Granulocytes 0 Not Estab. %   Immature Grans (Abs) 0.0 0.0 - 0.1 x10E3/uL    Assessment & Plan:   Problem List Items Addressed This Visit       Endocrine   Diabetes mellitus (HCC)   Relevant Medications   gabapentin (NEURONTIN) 100 MG capsule     Genitourinary   BPH (benign prostatic hyperplasia)     Other   Hyperlipemia - Primary    Blood work looks decent.  A1c is up slightly we discussed diet and changes.  He is currently doing 3 or more ensures a day and we instructed to keep it at 3 so that does not kick up his blood sugars and triglycerides. Follow up plan: Return in about 3 months (around 10/09/2023), or if symptoms worsen or fail to improve, for Diabetes hyperlipidemia recheck.  Counseling provided for all of the vaccine components No orders of the defined types were placed in this encounter.   Arville Care, MD [facility] 07/10/2023, 8:38 AM

## 2023-08-26 DIAGNOSIS — R0902 Hypoxemia: Secondary | ICD-10-CM | POA: Diagnosis not present

## 2023-08-26 DIAGNOSIS — E870 Hyperosmolality and hypernatremia: Secondary | ICD-10-CM | POA: Diagnosis not present

## 2023-08-26 DIAGNOSIS — R4781 Slurred speech: Secondary | ICD-10-CM | POA: Diagnosis not present

## 2023-08-26 DIAGNOSIS — R000 Tachycardia, unspecified: Secondary | ICD-10-CM | POA: Diagnosis not present

## 2023-08-26 DIAGNOSIS — I10 Essential (primary) hypertension: Secondary | ICD-10-CM | POA: Diagnosis not present

## 2023-08-26 DIAGNOSIS — E860 Dehydration: Secondary | ICD-10-CM | POA: Diagnosis not present

## 2023-08-26 DIAGNOSIS — E1100 Type 2 diabetes mellitus with hyperosmolarity without nonketotic hyperglycemic-hyperosmolar coma (NKHHC): Secondary | ICD-10-CM | POA: Diagnosis not present

## 2023-08-26 DIAGNOSIS — E871 Hypo-osmolality and hyponatremia: Secondary | ICD-10-CM | POA: Diagnosis not present

## 2023-08-26 DIAGNOSIS — E7220 Disorder of urea cycle metabolism, unspecified: Secondary | ICD-10-CM | POA: Diagnosis not present

## 2023-08-26 DIAGNOSIS — R404 Transient alteration of awareness: Secondary | ICD-10-CM | POA: Diagnosis not present

## 2023-08-26 DIAGNOSIS — Z79899 Other long term (current) drug therapy: Secondary | ICD-10-CM | POA: Diagnosis not present

## 2023-08-26 DIAGNOSIS — J449 Chronic obstructive pulmonary disease, unspecified: Secondary | ICD-10-CM | POA: Diagnosis not present

## 2023-08-26 DIAGNOSIS — E785 Hyperlipidemia, unspecified: Secondary | ICD-10-CM | POA: Diagnosis not present

## 2023-08-26 DIAGNOSIS — N179 Acute kidney failure, unspecified: Secondary | ICD-10-CM | POA: Diagnosis not present

## 2023-08-26 DIAGNOSIS — G629 Polyneuropathy, unspecified: Secondary | ICD-10-CM | POA: Diagnosis not present

## 2023-08-26 DIAGNOSIS — G9341 Metabolic encephalopathy: Secondary | ICD-10-CM | POA: Diagnosis not present

## 2023-08-26 DIAGNOSIS — R739 Hyperglycemia, unspecified: Secondary | ICD-10-CM | POA: Diagnosis not present

## 2023-08-26 DIAGNOSIS — Z7902 Long term (current) use of antithrombotics/antiplatelets: Secondary | ICD-10-CM | POA: Diagnosis not present

## 2023-08-26 DIAGNOSIS — I672 Cerebral atherosclerosis: Secondary | ICD-10-CM | POA: Diagnosis not present

## 2023-08-26 DIAGNOSIS — G9389 Other specified disorders of brain: Secondary | ICD-10-CM | POA: Diagnosis not present

## 2023-08-26 DIAGNOSIS — K8020 Calculus of gallbladder without cholecystitis without obstruction: Secondary | ICD-10-CM | POA: Diagnosis not present

## 2023-08-31 ENCOUNTER — Telehealth: Payer: Self-pay

## 2023-08-31 NOTE — Transitions of Care (Post Inpatient/ED Visit) (Signed)
08/31/2023  Name: Colin Mitchell MRN: [id number] DOB: 1951-08-06  Today's TOC FU Call Status: Today's TOC FU Call Status:: Successful TOC FU Call Completed TOC FU Call Complete Date: 08/31/23 Patient's Name and Date of Birth confirmed.  Transition Care Management Follow-up Telephone Call Date of Discharge: 08/28/23 Discharge Facility: Other ([facility]) Name of Other ([facility]) Discharge Facility: [facility] Type of Discharge: Inpatient Admission Primary Inpatient Discharge Diagnosis:: Altered Mental Status How have you been since you were released from the hospital?: Better (Per patients partner, Colin Mitchell) Any questions or concerns?: Yes Patient Questions/Concerns:: When is the home health nurse coming? Patient Questions/Concerns Addressed: Other: (Call placed to [facility], spoke wiht Stephanie.  She could not find the referral but was going to call the nurse who discharged patient for orders to be sent again.)  Items Reviewed: Did you receive and understand the discharge instructions provided?: Yes Medications obtained,verified, and reconciled?: Yes (Medications Reviewed) Any new allergies since your discharge?: No Dietary orders reviewed?: Yes Type of Diet Ordered:: Carbohydrate controlled Do you have support at home?: Yes People in Home: significant other Name of Support/Comfort Primary Source: [place]  Medications Reviewed Today: Medications Reviewed Today     Reviewed by Jodelle Gross, RN (Case Manager) on 08/31/23 at 1233  Med List Status: <None>   Medication Order Taking? Sig Documenting Provider Last Dose Status Informant  Accu-Chek Softclix Lancets lancets [id number] Yes SMARTSIG:Topical 1-3 Times Daily [provider] Taking Active   albuterol (VENTOLIN HFA) 108 (90 Base) MCG/ACT inhaler [id number] Yes 2 PUFFS EVERY 6 HOURS AS NEEDED FOR WHEEZING OR SHORTNESS OF BREATH Dettinger, Elige Radon, MD Taking Active   azelastine (ASTELIN) 0.1 % nasal spray  [id number] Yes 2 SPRAYS IN EACH NOSTRIL 2 TIMES A DAY Dettinger, Elige Radon, MD Taking Active   Blood Glucose Monitoring Suppl (ACCU-CHEK GUIDE) w/Device KIT [id number] Yes CHECK BLOOD SUGAR AS DIRECTED [provider] Taking Active   clopidogrel (PLAVIX) 75 MG tablet [id number] Yes Take 1 tablet (75 mg total) by mouth daily. Dettinger, Elige Radon, MD Taking Active   finasteride (PROSCAR) 5 MG tablet [id number] Yes Take 1 tablet (5 mg total) by mouth daily. Dettinger, Elige Radon, MD Taking Active   fluticasone [facility]) 50 MCG/ACT nasal spray [id number] Yes 1 SPRAY IN EACH NOSTRIL DAILY AS NEEDED FOR ALLERGIES Dettinger, Elige Radon, MD Taking Active   gabapentin (NEURONTIN) 100 MG capsule [id number] Yes Take 1 capsule (100 mg total) by mouth at bedtime. Dettinger, Elige Radon, MD Taking Active   insulin lispro (HUMALOG) 100 UNIT/ML KwikPen [id number] Yes Inject between 0-5 units before meals 3 times a day using the following scale: Blood glucose 61-150 = 0 units, 151-200 = 2 units, 201-250 = 3 units, 251-300 = 4 units, 301-350 = 5 units, above 350 call provider [provider] Taking Active   [employer] BLOOD GLUCOSE TEST test strip [id number] Yes Use to check blood sugar as directed with insulin 3 times a day & for symptoms of high or low blood sugar. [provider] Taking Active   LANTUS SOLOSTAR 100 UNIT/ML Solostar Pen [id number] Yes Inject 0.05 mLs into the skin daily. [provider] Taking Active   pravastatin (PRAVACHOL) 40 MG tablet [id number] Yes Take 1 tablet (40 mg total) by mouth daily. Dettinger, Elige Radon, MD Taking Active   tamsulosin [facility]) 0.4 MG CAPS capsule [id number] Yes Take 1 capsule (0.4 mg total) by mouth 2 (two) times daily. Dettinger, Elige Radon, MD Taking Active   [facility] PEN NEEDLE  32G X 4 MM MISC [id number] Yes Use with insulin up to 4 times/day as needed. [provider] Taking Active   varenicline (CHANTIX) 1 MG tablet [id number] Yes TAKE ONE  TABLET TWICE DAILY Dettinger, Elige Radon, MD Taking Active             Home Care and Equipment/Supplies: Were Home Health Services Ordered?: Yes Name of Home Health Agency:: [facility] Has Agency set up a time to come to your home?: No EMR reviewed for Home Health Orders:  (Contacted agency) Any new equipment or medical supplies ordered?: Yes Name of Medical supply agency?: Blood Glucose meter, lancets Were you able to get the equipment/medical supplies?: Yes Do you have any questions related to the use of the equipment/supplies?: No  Functional Questionnaire: Do you need assistance with bathing/showering or dressing?: Yes Do you need assistance with meal preparation?: No Do you need assistance with eating?: No Do you have difficulty maintaining continence: No Do you need assistance with getting out of bed/getting out of a chair/moving?: No Do you have difficulty managing or taking your medications?: Yes  Follow up appointments reviewed: PCP Follow-up appointment confirmed?: Yes Date of PCP follow-up appointment?: 09/02/23 Follow-up Provider: Dr. Louanne Skye Specialist [facility] Follow-up appointment confirmed?: NA Do you need transportation to your follow-up appointment?: No Do you understand care options if your condition(s) worsen?: Yes-patient verbalized understanding  Jodelle Gross RN, BSN, CCM RN Care Manager  Transitions of Care  [facility]  [phone]

## 2023-09-02 ENCOUNTER — Encounter: Payer: Self-pay | Admitting: Family Medicine

## 2023-09-02 ENCOUNTER — Inpatient Hospital Stay: Payer: 59

## 2023-09-02 ENCOUNTER — Ambulatory Visit: Payer: 59

## 2023-09-02 VITALS — BP 80/58 | HR 72 | Ht 65.0 in | Wt 176.0 lb

## 2023-09-02 DIAGNOSIS — Z794 Long term (current) use of insulin: Secondary | ICD-10-CM | POA: Diagnosis not present

## 2023-09-02 DIAGNOSIS — E1100 Type 2 diabetes mellitus with hyperosmolarity without nonketotic hyperglycemic-hyperosmolar coma (NKHHC): Secondary | ICD-10-CM | POA: Diagnosis not present

## 2023-09-02 DIAGNOSIS — R4182 Altered mental status, unspecified: Secondary | ICD-10-CM

## 2023-09-02 DIAGNOSIS — E1169 Type 2 diabetes mellitus with other specified complication: Secondary | ICD-10-CM | POA: Diagnosis not present

## 2023-09-02 MED ORDER — FREESTYLE LIBRE 3 SENSOR MISC: 1.0000 | 3 refills | Status: DC

## 2023-09-02 MED ORDER — FREESTYLE LIBRE 3 READER DEVI: 1.0000 | Freq: Four times a day (QID) | 1 refills | Status: DC

## 2023-09-02 NOTE — Addendum Note (Signed)
Addended by: Arville Care on: 09/02/2023 11:38 AM   Modules accepted: Orders

## 2023-09-02 NOTE — Progress Notes (Signed)
BP (!) 80/58   Pulse 72   Ht 5\' 5"  (1.651 m)   Wt 176 lb (79.8 kg)   SpO2 92%   BMI 29.29 kg/m    Subjective:   Patient ID: Colin Mitchell, male    DOB: 12-May-1951, 72 y.o.   MRN: [id number]  HPI: Colin Mitchell is a 72 y.o. male presenting on 09/02/2023 for Hospitalization Follow-up (Altered mental status/. New dx of DM)   HPI Hospital follow-up/transition of care Patient is coming in today for hospital follow-up/transition of care.  He was contacted on 08/31/2023 by Jodelle Gross, RN.  He was admitted to the hospital on 08/26/2023 and discharged on 08/28/2023 he was admitted for altered mental status and HHS.  He was also found to be hyponatremic and dehydrated.  Patient's A1c in the hospital was 11.0 and discharged with Lantus 5 units at night and sliding scale Humalog insulin.  He is checking his blood sugar via meter.  His last A1c in September was 7.0.  We discussed any changes and he does not admit to any dietary or lifestyle changes recently.  He was started on both Humalog and Lantus and he is taking 5 units of Lantus and doing sliding scale with Humalog.  His blood sugars have been running between the 100 and the 200s.  He was altered initially when he went in but he is not having any more altered mental status since he got out.  Relevant past medical, surgical, family and social history reviewed and updated as indicated. Interim medical history since our last visit reviewed. Allergies and medications reviewed and updated.  Review of Systems  Constitutional:  Negative for chills and fever.  Eyes:  Negative for discharge.  Respiratory:  Negative for shortness of breath and wheezing.   Cardiovascular:  Negative for chest pain and leg swelling.  Skin:  Negative for rash.  All other systems reviewed and are negative.   Per HPI unless specifically indicated above   Allergies as of 09/02/2023       Reactions   Sulfa Antibiotics    Aspirin Rash   Ibuprofen Rash    Penicillins Rash        Medication List        Accurate as of September 02, 2023 11:37 AM. If you have any questions, ask your nurse or doctor.          Accu-Chek Guide w/Device Kit CHECK BLOOD SUGAR AS DIRECTED   Accu-Chek Softclix Lancets lancets SMARTSIG:Topical 1-3 Times Daily   albuterol 108 (90 Base) MCG/ACT inhaler Commonly known as: VENTOLIN HFA 2 PUFFS EVERY 6 HOURS AS NEEDED FOR WHEEZING OR SHORTNESS OF BREATH   azelastine 0.1 % nasal spray Commonly known as: ASTELIN 2 SPRAYS IN EACH NOSTRIL 2 TIMES A DAY   clopidogrel 75 MG tablet Commonly known as: PLAVIX Take 1 tablet (75 mg total) by mouth daily.   finasteride 5 MG tablet Commonly known as: Proscar Take 1 tablet (5 mg total) by mouth daily.   fluticasone 50 MCG/ACT nasal spray Commonly known as: FLONASE 1 SPRAY IN EACH NOSTRIL DAILY AS NEEDED FOR ALLERGIES   FreeStyle Libre 3 Reader Devi 1 each by Does not apply route 4 (four) times daily. Started by: Elige Radon Jetson Pickrel   FreeStyle Libre 3 Sensor Misc 1 each by Does not apply route every 14 (fourteen) days. Place 1 sensor on the skin every 14 days. Use to check glucose continuously Started by: Elige Radon Mykah Shin   gabapentin  100 MG capsule Commonly known as: NEURONTIN Take 1 capsule (100 mg total) by mouth at bedtime.   insulin lispro 100 UNIT/ML KwikPen Commonly known as: HUMALOG Inject between 0-5 units before meals 3 times a day using the following scale: Blood glucose 61-150 = 0 units, 151-200 = 2 units, 201-250 = 3 units, 251-300 = 4 units, 301-350 = 5 units, above 350 call provider   [employer] Blood Glucose Test test strip Generic drug: glucose blood Use to check blood sugar as directed with insulin 3 times a day & for symptoms of high or low blood sugar.   Lantus SoloStar 100 UNIT/ML Solostar Pen Generic drug: insulin glargine Inject 0.05 mLs into the skin daily.   pravastatin 40 MG tablet Commonly known as: PRAVACHOL Take 1 tablet  (40 mg total) by mouth daily.   tamsulosin 0.4 MG Caps capsule Commonly known as: FLOMAX Take 1 capsule (0.4 mg total) by mouth 2 (two) times daily.   UltiGuard SafePack Pen Needle 32G X 4 MM Misc Generic drug: Insulin Pen Needle Use with insulin up to 4 times/day as needed.   varenicline 1 MG tablet Commonly known as: CHANTIX TAKE ONE TABLET TWICE DAILY         Objective:   BP (!) 80/58   Pulse 72   Ht 5\' 5"  (1.651 m)   Wt 176 lb (79.8 kg)   SpO2 92%   BMI 29.29 kg/m   Wt Readings from Last 3 Encounters:  09/02/23 176 lb (79.8 kg)  07/10/23 183 lb (83 kg)  04/03/23 171 lb (77.6 kg)    Physical Exam Vitals and nursing note reviewed.  Constitutional:      General: He is not in acute distress.    Appearance: He is well-developed. He is not diaphoretic.  Eyes:     General: No scleral icterus.    Conjunctiva/sclera: Conjunctivae normal.  Neck:     Thyroid: No thyromegaly.  Cardiovascular:     Rate and Rhythm: Normal rate and regular rhythm.     Heart sounds: Normal heart sounds. No murmur heard. Pulmonary:     Effort: Pulmonary effort is normal. No respiratory distress.     Breath sounds: Normal breath sounds. No wheezing.  Musculoskeletal:     Cervical back: Neck supple.  Lymphadenopathy:     Cervical: No cervical adenopathy.  Skin:    General: Skin is warm and dry.     Findings: No rash.  Neurological:     Mental Status: He is alert and oriented to person, place, and time.     Coordination: Coordination normal.  Psychiatric:        Behavior: Behavior normal.       Assessment & Plan:   Problem List Items Addressed This Visit       Endocrine   Diabetes mellitus (HCC)   Relevant Medications   Continuous Glucose Sensor (FREESTYLE LIBRE 3 SENSOR) MISC   Continuous Glucose Receiver (FREESTYLE LIBRE 3 READER) DEVI   Other Relevant Orders   AMB Referral [facility] Care Management   Other Visit Diagnoses     Hyperosmolar hyperglycemic state (HHS) (HCC)     -  Primary   Relevant Medications   Continuous Glucose Sensor (FREESTYLE LIBRE 3 SENSOR) MISC   Continuous Glucose Receiver (FREESTYLE LIBRE 3 READER) DEVI   Other Relevant Orders   AMB Referral [facility] Care Management       Continue the Lantus 5, will try for freestyle libre for him.  Continue Humalog sliding scale  for now.  Gave diet education about diabetes and what to avoid. Follow up plan: Return if symptoms worsen or fail to improve, for Has an appointment in 1 month, keep that appointment.  Counseling provided for all of the vaccine components Orders Placed This Encounter  Procedures   AMB Referral [facility] Care Management    Arville Care, MD [facility] 09/02/2023, 11:37 AM

## 2023-09-02 NOTE — Patient Outreach (Signed)
error 

## 2023-09-03 ENCOUNTER — Telehealth: Payer: Self-pay

## 2023-09-03 LAB — CBC WITH DIFFERENTIAL/PLATELET
Basophils Absolute: 0 10*3/uL (ref 0.0–0.2)
Basos: 1 %
EOS (ABSOLUTE): 0.1 10*3/uL (ref 0.0–0.4)
Eos: 3 %
Hematocrit: 40.2 % (ref 37.5–51.0)
Hemoglobin: 12.8 g/dL — ABNORMAL LOW (ref 13.0–17.7)
Immature Grans (Abs): 0 10*3/uL (ref 0.0–0.1)
Immature Granulocytes: 0 %
Lymphocytes Absolute: 0.7 10*3/uL (ref 0.7–3.1)
Lymphs: 21 %
MCH: 26.2 pg — ABNORMAL LOW (ref 26.6–33.0)
MCHC: 31.8 g/dL (ref 31.5–35.7)
MCV: 82 fL (ref 79–97)
Monocytes Absolute: 0.4 10*3/uL (ref 0.1–0.9)
Monocytes: 10 %
Neutrophils Absolute: 2.3 10*3/uL (ref 1.4–7.0)
Neutrophils: 65 %
Platelets: 184 10*3/uL (ref 150–450)
RBC: 4.89 x10E6/uL (ref 4.14–5.80)
RDW: 15.5 % — ABNORMAL HIGH (ref 11.6–15.4)
WBC: 3.5 10*3/uL (ref 3.4–10.8)

## 2023-09-03 LAB — CMP14+EGFR
ALT: 73 [IU]/L — ABNORMAL HIGH (ref 0–44)
AST: 56 [IU]/L — ABNORMAL HIGH (ref 0–40)
Albumin: 4 g/dL (ref 3.8–4.8)
Alkaline Phosphatase: 67 [IU]/L (ref 44–121)
BUN/Creatinine Ratio: 12 (ref 10–24)
BUN: 12 mg/dL (ref 8–27)
Bilirubin Total: 0.2 mg/dL (ref 0.0–1.2)
CO2: 23 mmol/L (ref 20–29)
Calcium: 9.4 mg/dL (ref 8.6–10.2)
Chloride: 104 mmol/L (ref 96–106)
Creatinine, Ser: 0.98 mg/dL (ref 0.76–1.27)
Globulin, Total: 3.2 g/dL (ref 1.5–4.5)
Glucose: 145 mg/dL — ABNORMAL HIGH (ref 70–99)
Potassium: 4.5 mmol/L (ref 3.5–5.2)
Sodium: 141 mmol/L (ref 134–144)
Total Protein: 7.2 g/dL (ref 6.0–8.5)
eGFR: 82 mL/min/{1.73_m2} (ref >59)

## 2023-09-03 NOTE — Progress Notes (Signed)
   Care Guide Note  09/03/2023 Name: Colin Mitchell MRN: [id number] DOB: August 04, 1951  Referred by: Dettinger, Elige Radon, MD Reason for referral : Care Coordination (Outreach to schedule with Pharm d )   Colin Mitchell is a 72 y.o. year old male who is a primary care patient of Dettinger, Elige Radon, MD. Colin Mitchell was referred to the pharmacist for assistance related to DM.    An unsuccessful telephone outreach was attempted today to contact the patient who was referred to the pharmacy team for assistance with medication management. Additional attempts will be made to contact the patient.   Penne Lash, RMA Care Guide [facility]  [place], [place] [address] Direct Dial: [phone] [email or web address]

## 2023-09-04 ENCOUNTER — Telehealth: Payer: Self-pay | Admitting: Family Medicine

## 2023-09-04 NOTE — Telephone Encounter (Signed)
Received CRM message from [facility] stating that [facility] called regarding patients referral for colonoscopy.  Spoke with patients significant other to see if patient is wanting to proceed with having a colonoscopy that we referred him for earlier this year. Patient has declined at this time. Does not need a new referral for colonoscopy at this time.

## 2023-09-08 NOTE — Progress Notes (Signed)
   Care Guide Note  09/08/2023 Name: Colin Mitchell MRN: [id number] DOB: 15-Aug-1951  Referred by: Dettinger, Elige Radon, MD Reason for referral : Care Coordination (Outreach to schedule with Pharm d )   Colin Mitchell is a 72 y.o. year old male who is a primary care patient of Dettinger, Elige Radon, MD. Colin Mitchell was referred to the pharmacist for assistance related to DM.    Successful contact was made with the patient to discuss pharmacy services including being ready for the pharmacist to call at least 5 minutes before the scheduled appointment time, to have medication bottles and any blood sugar or blood pressure readings ready for review. The patient agreed to meet with the pharmacist via with the pharmacist via face to face 09/15/2023 on (date/time).    Penne Lash, RMA Care Guide [facility]  [place], [place] [address] Direct Dial: [phone] [email or web address]

## 2023-09-09 ENCOUNTER — Other Ambulatory Visit: Payer: 59

## 2023-09-09 ENCOUNTER — Other Ambulatory Visit: Payer: Self-pay

## 2023-09-09 DIAGNOSIS — R748 Abnormal levels of other serum enzymes: Secondary | ICD-10-CM

## 2023-09-10 ENCOUNTER — Other Ambulatory Visit: Payer: Self-pay

## 2023-09-15 ENCOUNTER — Ambulatory Visit (INDEPENDENT_AMBULATORY_CARE_PROVIDER_SITE_OTHER): Payer: 59 | Admitting: Pharmacist

## 2023-09-15 DIAGNOSIS — E119 Type 2 diabetes mellitus without complications: Secondary | ICD-10-CM

## 2023-09-15 DIAGNOSIS — Z794 Long term (current) use of insulin: Secondary | ICD-10-CM | POA: Diagnosis not present

## 2023-09-15 DIAGNOSIS — Z7984 Long term (current) use of oral hypoglycemic drugs: Secondary | ICD-10-CM | POA: Diagnosis not present

## 2023-09-15 NOTE — Patient Instructions (Addendum)
It was great to see you today!  Your goal blood sugar in the morning is between 80-130 mg/dL. Your goal blood sugar after eating is less than 180 mg/dL. If your blood sugars are consistently over 200, give the office a call.   Stop taking your meal time insulin (Humalog). You can keep it in the fridge in case it is needed later.   Continue the once daily insulin glargine (Lantus) 5 units once daily. If you would like to move this dose to he morning, skip your dose tonight and take in 6:30-7 AM tomorrow morning.   Until you start using the [facility] Continuous Glucose Monitor - keep checking blood sugar once daily in the morning before eating and occasionally (a couple times a week ) 2 hours after eating.   Keep up the great work with your diet and stopping sugary beverages.

## 2023-09-15 NOTE — Progress Notes (Signed)
09/15/2023 Name: Colin Mitchell MRN: [id number] DOB: 1950/12/07  Chief Complaint  Patient presents with   Diabetes   JAQUAI OFFERMANN is a 72 y.o. year old male who was referred for medication management by their primary care provider, Dettinger, Elige Radon, MD. They presented for a face to face visit today.   They were referred to the pharmacist by their PCP for assistance in managing diabetes. PMH includes BPH, blindness of L eye, pharyngeal cancer, HLD.   Subjective: Pt was recently admitted to [facility] with AMS 2/2 HHS. Per wife - pt went outside to the trash cans that evening and then became very confused - wandered around outside from 11PM to [facility]. She had taken pain medication which caused her to fall asleep, and she did not realize that he was gone. Once admitted, found to have A1c of 11.0, BG 489. Also had elevated LFTs on arrival - US revealed cholelithiasis without evidence of acute cholecystitis. Pt was discharged with 5 units of Lantus nightly and 0-5 units Humalog sliding scale with meals. Wife is assisting with finger pricks and insulin administration due to vision impairment.   At f/u with Dr. Louanne Skye on 09/02/23 - reported BG running between 100-200s. Denies recurrent AMS. Attempted to apply FL3 sensor, but both sensors fell off. They have requested to have replacements sent from [employer]. The pharmacist at [facility] has offered to help them apply the next sensor. Did not bring sensor, reader, or cell phone with them to appt today.   Care Team: Primary Care Provider: Dettinger, Elige Radon, MD ; Next Scheduled Visit: 10/09/23  Medication Access/Adherence  Current Pharmacy:  [facility] [place], [place] - [address] [place] [address] Phone: [phone] Fax: [phone]   Patient reports affordability concerns with their medications: No  Patient reports access/transportation concerns to their pharmacy:  No  Patient reports adherence concerns with their medications:  No     Diabetes:  Current medications: insulin glargine (Lantus) 5 units subcutaneous nightly (~10:30PM), insulin lispro (Humalog) sliding scale with meals: Inject between 0-5 units before meals 3 times a day using the following scale: Blood glucose 61-150 = 0 units, 151-200 = 2 units, 201-250 = 3 units, 251-300 = 4 units, 301-350 = 5 units, above 350 call provider (has not needed)  Medications tried in the past: none  Current glucose readings: fasting and before meals 11/19: 7AM 119, 1 PM 97 11/18: 7AM 100, 1PM 108, 6PM 89, 11PM 140 11/17: 8AM 103, 1PM 106, 6PM 68, 10 PM 128 11/16: 8AM 126, 12 PM 104, 5PM 116, 10 PM 104 11/15: 8AM 128, 12 PM 107, 6 PM 88 11/14: 8AM 141, 2PM 106, 6PM 179, 11PM 106 11/13: 8AM 137, 1 PM 86, 4PM 114, 11PM 154 11/12: 8AM 134, 12PM 127, 5PM 101  Has not needed any meal time insulin in the past week - if did need, it was only 2 units (BG 151-200)  Using Accu-Chek meter; testing 4 times daily - pt would like to decrease frequency of finger sticks  Patient denies hypoglycemic s/sx including dizziness, shakiness, sweating. Patient denies hyperglycemic symptoms including polyuria, polydipsia, polyphagia, nocturia, neuropathy, blurred vision.  Current meal patterns: 3 meals a day - endorses large appetite, often feels hungry - Breakfast: sausage, egg, toast - bacon, tomato sandwich - Lunch: burger, usually soup - yesterday (beans and greens) - Supper: beans and greens - Snacks: beans and greens  -  Drinks diet mountain dew, low sugar protein drinks  Had been drinking 6 ensures a day prior to hospitalization in addition to apple juice, orange juice, grape juice, soda- which he has completely stopped since discharge.   Current physical activity: limited by mobility, vision impairment  Current medication access support: none- Medicaid  Quit smoking 6 mo ago.  Objective:  Lab Results   Component Value Date   HGBA1C 7.0 (H) 07/08/2023    Lab Results  Component Value Date   CREATININE 0.98 09/02/2023   BUN 12 09/02/2023   NA 141 09/02/2023   K 4.5 09/02/2023   CL 104 09/02/2023   CO2 23 09/02/2023    Lab Results  Component Value Date   CHOL 181 07/08/2023   HDL 33 (L) 07/08/2023   LDLCALC 97 07/08/2023   TRIG 304 (H) 07/08/2023   CHOLHDL 5.5 (H) 07/08/2023    Medications Reviewed Today     Reviewed by Nelma Rothman, Student-PharmD (Student-PharmD) on 09/15/23 at 1518  Med List Status: <None>   Medication Order Taking? Sig Documenting Provider Last Dose Status Informant  Accu-Chek Softclix Lancets lancets [id number]  SMARTSIG:Topical 1-3 Times Daily [provider]  Active   albuterol (VENTOLIN HFA) 108 (90 Base) MCG/ACT inhaler [id number] Yes 2 PUFFS EVERY 6 HOURS AS NEEDED FOR WHEEZING OR SHORTNESS OF BREATH Dettinger, Elige Radon, MD Taking Active   azelastine (ASTELIN) 0.1 % nasal spray [id number] Yes 2 SPRAYS IN EACH NOSTRIL 2 TIMES A DAY Dettinger, Elige Radon, MD Taking Active   Blood Glucose Monitoring Suppl (ACCU-CHEK GUIDE) w/Device KIT [id number]  CHECK BLOOD SUGAR AS DIRECTED [provider]  Active   clopidogrel (PLAVIX) 75 MG tablet [id number] Yes Take 1 tablet (75 mg total) by mouth daily. Dettinger, Elige Radon, MD Taking Active   Continuous Glucose Receiver (FREESTYLE LIBRE 3 READER) DEVI [id number]  1 each by Does not apply route 4 (four) times daily. Dettinger, Elige Radon, MD  Active   Continuous Glucose Sensor (FREESTYLE LIBRE 3 SENSOR) [place] [id number]  1 each by Does not apply route every 14 (fourteen) days. Place 1 sensor on the skin every 14 days. Use to check glucose continuously Dettinger, Elige Radon, MD  Active   finasteride (PROSCAR) 5 MG tablet [id number] Yes Take 1 tablet (5 mg total) by mouth daily. Dettinger, Elige Radon, MD Taking Active   fluticasone [facility]) 50 MCG/ACT nasal spray [id number] Yes 1 SPRAY IN EACH NOSTRIL DAILY AS  NEEDED FOR ALLERGIES Dettinger, Elige Radon, MD Taking Active   gabapentin (NEURONTIN) 100 MG capsule [id number] Yes Take 1 capsule (100 mg total) by mouth at bedtime. Dettinger, Elige Radon, MD Taking Active   insulin lispro (HUMALOG) 100 UNIT/ML KwikPen [id number] No Inject between 0-5 units before meals 3 times a day using the following scale: Blood glucose 61-150 = 0 units, 151-200 = 2 units, 201-250 = 3 units, 251-300 = 4 units, 301-350 = 5 units, above 350 call provider  Patient not taking: Reported on 09/15/2023   [provider] Not Taking Active   [employer] BLOOD GLUCOSE TEST test strip [id number]  Use to check blood sugar as directed with insulin 3 times a day & for symptoms of high or low blood sugar. [provider]  Active   LANTUS SOLOSTAR 100 UNIT/ML Solostar Pen [id number] No Inject 0.05 mLs into the skin daily. [provider] Taking Differently Active            Med Note Earley Abide Sep 15, 2023  3:15 PM) Takes it if blood sugar is over 151.  pravastatin (PRAVACHOL) 40 MG tablet [id number] Yes Take 1 tablet (40 mg total) by mouth daily. Dettinger, Elige Radon, MD Taking Active   tamsulosin [facility]) 0.4 MG CAPS capsule [id number] Yes Take 1 capsule (0.4 mg total) by mouth 2 (two) times daily. Dettinger, Elige Radon, MD Taking Active   ULTIGUARD SAFEPACK PEN NEEDLE 32G X 4 MM MISC [id number]  Use with insulin up to 4 times/day as needed. [provider]  Active   varenicline (CHANTIX) 1 MG tablet [id number] No TAKE ONE TABLET TWICE DAILY  Patient not taking: Reported on 09/15/2023   Dettinger, Elige Radon, MD Not Taking Active              Assessment/Plan:   Diabetes: - Currently controlled per recent self-monitored BG, though A1c was elevated to 11.0% above goal <7% on admission. Appears that daily basal insulin and diet changes have resulted in rapid improvement in BG. Patient without any s/sx of hypoglycemia. GLP-1RA may help control post-prandial  BG, although noted cholelithiasis on RUQ [place] during admission, which may increase risk of cholecystitis with GLP-1RA, so risk may outweigh benefit given overall poor functional status. Could consider initiation of metformin (eGFR 82) if BG become elevated. - Reviewed long term cardiovascular and renal outcomes of uncontrolled blood sugar - Reviewed goal A1c, goal fasting, and goal 2 hour post prandial glucose. Instructed to contact office if BG are frequently elevated > 200 mg/dL or < 70 mg/d.  - Reviewed dietary modifications including continuing to minimize intake of carbohydrates, sugary beverages. Focus on increasing intake of protein and non-starchy vegetables.  - Recommend to continue Lantus (insulin glargine) 5 units subcutaneous daily - discussed plan to transition dosing to the AM rather than the PM per patient preference - Recommend to STOP Humalog (insulin lispro) sliding scale regimen. Instructed patient to keep the medication in the fridge in case it is needed later.   - Recommend to check glucose once daily fasting and occasionally 2 hours after eating. Encouraged patient to retrial FL3 sensor once they obtain additional supply. Provided with Tegaderm patch to place over the sensor if there are issues with it staying on.  - Congratulated on quitting smoking!  Medication Management - Unclear why patient is on clopidogrel - no hx of stroke, MI or PAD in chart. May increase risk of bleeding considering frailty and fall risk.    Follow Up Plan: PCP 10/09/23  Nils Pyle, PharmD PGY1 Pharmacy Resident  Kieth Brightly, PharmD, BCACP, CPP Clinical Pharmacist, [facility]

## 2023-09-17 ENCOUNTER — Ambulatory Visit: Payer: 59

## 2023-09-21 ENCOUNTER — Other Ambulatory Visit: Payer: Self-pay | Admitting: *Deleted

## 2023-09-21 ENCOUNTER — Other Ambulatory Visit: Payer: 59

## 2023-09-21 ENCOUNTER — Telehealth: Payer: Self-pay | Admitting: *Deleted

## 2023-09-21 DIAGNOSIS — R748 Abnormal levels of other serum enzymes: Secondary | ICD-10-CM | POA: Diagnosis not present

## 2023-09-21 MED ORDER — ACCU-CHEK SOFTCLIX LANCETS MISC: 1.0000 | Freq: Three times a day (TID) | 1 refills | Status: DC

## 2023-09-21 NOTE — Telephone Encounter (Signed)
Refills of lancets sent to pharmacy

## 2023-09-21 NOTE — Telephone Encounter (Signed)
Copied from CRM [id number]. Topic: Clinical - Medication Refill >> Sep 21, 2023  3:28 PM Tiffany H wrote: Most Recent Primary Care Visit:  Provider: Vanice Sarah D  Department: [facility]  Visit Type: PHARMACY CLINIC  Date: 09/15/2023  Medication: Accu-Chek Softclix Lancets lancets [employer] BLOOD GLUCOSE TEST test strip  Has the patient contacted their pharmacy? Yes (Agent: If no, request that the patient contact the pharmacy for the refill. If patient does not wish to contact the pharmacy document the reason why and proceed with request.) (Agent: If yes, when and what did the pharmacy advise?)  Is this the correct pharmacy for this prescription? Yes If no, delete pharmacy and type the correct one.  This is the patient's preferred pharmacy:  [facility] [place], [place] - [address] [place] [address] Phone: [phone] Fax: [phone]   Has the prescription been filled recently? No. Patient was released with a prescription from discharging doctor. Patient is now out of supplies entirely. Morrice advised that she used an old needle to test patient today. Please expedite.   Is the patient out of the medication? Yes  Has the patient been seen for an appointment in the last year OR does the patient have an upcoming appointment? Yes  Can we respond through MyChart? No  Agent: Please be advised that Rx refills may take up to 3 business days. We ask that you follow-up with your pharmacy.

## 2023-09-22 ENCOUNTER — Telehealth: Payer: Self-pay

## 2023-09-22 LAB — HEPATIC FUNCTION PANEL
ALT: 9 [IU]/L (ref 0–44)
AST: 15 [IU]/L (ref 0–40)
Albumin: 4.4 g/dL (ref 3.8–4.8)
Alkaline Phosphatase: 60 [IU]/L (ref 44–121)
Bilirubin Total: 0.2 mg/dL (ref 0.0–1.2)
Bilirubin, Direct: 0.12 mg/dL (ref 0.00–0.40)
Total Protein: 7.5 g/dL (ref 6.0–8.5)

## 2023-09-22 NOTE — Telephone Encounter (Signed)
I do not see where Dr. Mardelle Matte has seen this pt. Perhaps, you may have sent to wrong provider.  If I am wrong and have overlooked something I do apologize.   Thank you,  Christy Gentles

## 2023-10-09 ENCOUNTER — Ambulatory Visit (INDEPENDENT_AMBULATORY_CARE_PROVIDER_SITE_OTHER): Payer: 59 | Admitting: Family Medicine

## 2023-10-09 ENCOUNTER — Encounter: Payer: Self-pay | Admitting: Family Medicine

## 2023-10-09 VITALS — BP 127/73 | HR 88 | Ht 65.0 in | Wt 171.0 lb

## 2023-10-09 DIAGNOSIS — Z7984 Long term (current) use of oral hypoglycemic drugs: Secondary | ICD-10-CM | POA: Diagnosis not present

## 2023-10-09 DIAGNOSIS — E1169 Type 2 diabetes mellitus with other specified complication: Secondary | ICD-10-CM

## 2023-10-09 DIAGNOSIS — Z794 Long term (current) use of insulin: Secondary | ICD-10-CM

## 2023-10-09 DIAGNOSIS — E782 Mixed hyperlipidemia: Secondary | ICD-10-CM

## 2023-10-09 DIAGNOSIS — E119 Type 2 diabetes mellitus without complications: Secondary | ICD-10-CM

## 2023-10-09 LAB — BAYER DCA HB A1C WAIVED: HB A1C (BAYER DCA - WAIVED): 9.1 % — ABNORMAL HIGH (ref 4.8–5.6)

## 2023-10-09 NOTE — Progress Notes (Signed)
BP 127/73   Pulse 88   Ht 5\' 5"  (1.651 m)   Wt 171 lb (77.6 kg)   SpO2 92%   BMI 28.46 kg/m    Subjective:   Patient ID: Colin Mitchell, male    DOB: 02/18/1951, 72 y.o.   MRN: [id number]  HPI: Colin Mitchell is a 72 y.o. male presenting on 10/09/2023 for Medical Management of Chronic Issues, Diabetes, and Hyperlipidemia   HPI Type 2 diabetes mellitus Patient comes in today for recheck of his diabetes. Patient has been currently taking Lantus 5 units but getting some hypoglycemic episodes, staying on the green except for the hypoglycemic episodes on his CGM. Patient is not currently on an ACE inhibitor/ARB. Patient has not seen an ophthalmologist this year. Patient denies any new issues with their feet. The symptom started onset as an adult hyperlipidemia ARE RELATED TO DM   Hyperlipidemia Patient is coming in for recheck of his hyperlipidemia. The patient is currently taking pravastatin. They deny any issues with myalgias or history of liver damage from it. They deny any focal numbness or weakness or chest pain.   Relevant past medical, surgical, family and social history reviewed and updated as indicated. Interim medical history since our last visit reviewed. Allergies and medications reviewed and updated.  Review of Systems  Constitutional:  Negative for chills and fever.  Eyes:  Negative for visual disturbance.  Respiratory:  Negative for shortness of breath and wheezing.   Cardiovascular:  Negative for chest pain and leg swelling.  Musculoskeletal:  Negative for back pain and gait problem.  Skin:  Negative for rash.  Neurological:  Negative for dizziness, weakness and light-headedness.  All other systems reviewed and are negative.   Per HPI unless specifically indicated above   Allergies as of 10/09/2023       Reactions   Sulfa Antibiotics    Aspirin Rash   Ibuprofen Rash   Penicillins Rash        Medication List        Accurate as of October 09, 2023 10:06 AM. If you have any questions, ask your nurse or doctor.          Accu-Chek Guide w/Device Kit CHECK BLOOD SUGAR AS DIRECTED   Accu-Chek Softclix Lancets lancets 1 each by Other route 3 (three) times daily.   albuterol 108 (90 Base) MCG/ACT inhaler Commonly known as: VENTOLIN HFA 2 PUFFS EVERY 6 HOURS AS NEEDED FOR WHEEZING OR SHORTNESS OF BREATH   azelastine 0.1 % nasal spray Commonly known as: ASTELIN 2 SPRAYS IN EACH NOSTRIL 2 TIMES A DAY   clopidogrel 75 MG tablet Commonly known as: PLAVIX Take 1 tablet (75 mg total) by mouth daily.   finasteride 5 MG tablet Commonly known as: Proscar Take 1 tablet (5 mg total) by mouth daily.   fluticasone 50 MCG/ACT nasal spray Commonly known as: FLONASE 1 SPRAY IN EACH NOSTRIL DAILY AS NEEDED FOR ALLERGIES   FreeStyle Libre 3 Reader Devi 1 each by Does not apply route 4 (four) times daily.   FreeStyle Libre 3 Sensor Misc 1 each by Does not apply route every 14 (fourteen) days. Place 1 sensor on the skin every 14 days. Use to check glucose continuously   gabapentin 100 MG capsule Commonly known as: NEURONTIN Take 1 capsule (100 mg total) by mouth at bedtime.   [employer] Blood Glucose Test test strip Generic drug: glucose blood Use to check blood sugar as directed with insulin 3 times a  day & for symptoms of high or low blood sugar.   Lantus SoloStar 100 UNIT/ML Solostar Pen Generic drug: insulin glargine Inject 0.05 mLs into the skin daily.   pravastatin 40 MG tablet Commonly known as: PRAVACHOL Take 1 tablet (40 mg total) by mouth daily.   tamsulosin 0.4 MG Caps capsule Commonly known as: FLOMAX Take 1 capsule (0.4 mg total) by mouth 2 (two) times daily.   UltiGuard SafePack Pen Needle 32G X 4 MM Misc Generic drug: Insulin Pen Needle Use with insulin up to 4 times/day as needed.         Objective:   BP 127/73   Pulse 88   Ht 5\' 5"  (1.651 m)   Wt 171 lb (77.6 kg)   SpO2 92%   BMI 28.46 kg/m    Wt Readings from Last 3 Encounters:  10/09/23 171 lb (77.6 kg)  09/02/23 176 lb (79.8 kg)  07/10/23 183 lb (83 kg)    Physical Exam Vitals and nursing note reviewed.  Constitutional:      General: He is not in acute distress.    Appearance: He is well-developed. He is not diaphoretic.  Eyes:     General: No scleral icterus.    Conjunctiva/sclera: Conjunctivae normal.  Neck:     Thyroid: No thyromegaly.  Cardiovascular:     Rate and Rhythm: Normal rate and regular rhythm.     Heart sounds: Normal heart sounds. No murmur heard. Pulmonary:     Effort: Pulmonary effort is normal. No respiratory distress.     Breath sounds: Normal breath sounds. No wheezing.  Musculoskeletal:        General: No swelling. Normal range of motion.     Cervical back: Neck supple.  Lymphadenopathy:     Cervical: No cervical adenopathy.  Skin:    General: Skin is warm and dry.     Findings: No rash.  Neurological:     Mental Status: He is alert and oriented to person, place, and time.     Coordination: Coordination normal.  Psychiatric:        Behavior: Behavior normal.       Assessment & Plan:   Problem List Items Addressed This Visit       Endocrine   Diabetes mellitus (HCC)     Other   Hyperlipemia   Other Visit Diagnoses       Diabetes mellitus treated with insulin and oral medication (HCC)    -  Primary   Relevant Orders   Bayer DCA Hb A1c Waived      A1c is 9.1 coming down, found out that patient was drinking 6 or 7 or 8 ensures a day and not eating normal foods and that is why his sugars were so high and needed open HHS.  On his CGM that he has here today his blood sugars are all in the green and he is having lows multiple lows throughout the days.  Recommended at this point to stop the Lantus because of the lows and continue with CGM.  He has stopped drinking ensures and eating normal foods. Follow up plan: Return in about 3 months (around 01/07/2024), or if symptoms worsen  or fail to improve, for Diabetes recheck.  Counseling provided for all of the vaccine components Orders Placed This Encounter  Procedures   Bayer DCA Hb A1c Waived    Arville Care, MD [facility] 10/09/2023, 10:06 AM

## 2023-11-02 ENCOUNTER — Telehealth: Payer: Self-pay | Admitting: Family Medicine

## 2023-11-02 ENCOUNTER — Ambulatory Visit: Payer: Self-pay | Admitting: Family Medicine

## 2023-11-02 NOTE — Telephone Encounter (Signed)
 Spoke with caregiver and they would like to go ahead and schedule an appointment to be seen.  Appointment scheduled with Dr Dettinger on 11/05/23.

## 2023-11-02 NOTE — Telephone Encounter (Signed)
 Please see previous telephone encounter.

## 2023-11-02 NOTE — Telephone Encounter (Signed)
 Copied from CRM [id number]. Topic: Clinical - Medical Advice >> Nov 02, 2023  9:39 AM Rona Ravens wrote: Reason for CRM: Danne Harbor called to have a Nurse give her a phone call regarding the patients blood sugar. Sent message to Clinic.

## 2023-11-02 NOTE — Telephone Encounter (Signed)
 Message from [name] sent at 11/02/2023  9:39 AM EST  Good morning Colin Mitchell called in to have someone give her a phone call at [phone] to speak her regarding the patients blood sugar.

## 2023-11-02 NOTE — Telephone Encounter (Signed)
 Blood sugar has been up and down over the weekend. States that no changes have been made with diet.  States that the last 2w sugar has been really good and pt has not needed any insulin.  Blood sugar has been as high as 200 over the weekend and as low as 65. Caregiver states that when it is that high they give 2 units of insulin. Confirmed this several time with care giver.  Caregiver does not understand why sugar is so up and down.

## 2023-11-02 NOTE — Telephone Encounter (Signed)
 Have him focus on drinks or anything else that might have changed.  And if he still struggling with this then please make an appointment to come in and we can look through things more extensively.  For now continue to monitor the blood sugars and let me know how it runs over this next few days.  Sometimes also patients can get an infection or cold or urinary tract infection and that can keep the sugars up so watch out for any signs of those symptoms.

## 2023-11-03 NOTE — Telephone Encounter (Signed)
 Appt made

## 2023-11-05 ENCOUNTER — Encounter: Payer: Self-pay | Admitting: Family Medicine

## 2023-11-05 ENCOUNTER — Ambulatory Visit: Payer: No Typology Code available for payment source | Admitting: Family Medicine

## 2023-11-05 VITALS — BP 149/68 | HR 98 | Ht 65.0 in | Wt 167.0 lb

## 2023-11-05 DIAGNOSIS — E1165 Type 2 diabetes mellitus with hyperglycemia: Secondary | ICD-10-CM

## 2023-11-05 DIAGNOSIS — R739 Hyperglycemia, unspecified: Secondary | ICD-10-CM

## 2023-11-05 DIAGNOSIS — Z794 Long term (current) use of insulin: Secondary | ICD-10-CM | POA: Diagnosis not present

## 2023-11-05 DIAGNOSIS — R6889 Other general symptoms and signs: Secondary | ICD-10-CM | POA: Diagnosis not present

## 2023-11-05 DIAGNOSIS — E1169 Type 2 diabetes mellitus with other specified complication: Secondary | ICD-10-CM

## 2023-11-05 NOTE — Progress Notes (Addendum)
 BP (!) 149/68   Pulse 98   Ht 5' 5 (1.651 m)   Wt 167 lb (75.8 kg)   SpO2 93%   BMI 27.79 kg/m    Subjective:   Patient ID: Colin Mitchell, male    DOB: 1951/02/03, 73 y.o.   MRN: [id number]  HPI: Colin Mitchell is a 73 y.o. male presenting on 11/05/2023 for Blood Sugar Problem (Numbers are up and down since changing Libre sensor)  Type 2 diabetes mellitus Patient presents for fluctuating blood glucose since the weekend. He changed his Libre sensor on Friday, and his blood glucose has been fluctuating between 60-200s since then. He denies any recent changes in diet or exercise. He denies any symptoms of infection, including fevers, chills, cough, congestion, abdominal pain, vomiting, diarrhea, dysuria, or hematuria. He does not have hypoglycemic symptoms when his glucose is in the 60s, but he does have urinary frequency when his glucose is in the 200s.   Relevant past medical, surgical, family and social history reviewed and updated as indicated. Interim medical history since our last visit reviewed. Allergies and medications reviewed and updated.  Review of Systems  Constitutional:  Negative for chills and fever.  HENT:  Negative for congestion, ear pain, rhinorrhea and sore throat.   Eyes:  Negative for visual disturbance.  Respiratory:  Negative for cough and shortness of breath.   Cardiovascular:  Negative for chest pain and leg swelling.  Gastrointestinal:  Negative for abdominal pain, constipation, diarrhea, nausea and vomiting.  Endocrine: Negative for polyuria.  Genitourinary:  Positive for frequency (with elevated blood glucose). Negative for dysuria, hematuria and urgency.  Neurological:  Negative for dizziness, weakness and headaches.  Psychiatric/Behavioral:  Negative for confusion and decreased concentration.     Per HPI unless specifically indicated above   Allergies as of 11/05/2023       Reactions   Sulfa Antibiotics    Aspirin Rash   Ibuprofen Rash    Penicillins Rash        Medication List        Accurate as of November 05, 2023  4:42 PM. If you have any questions, ask your nurse or doctor.          Accu-Chek Guide w/Device Kit CHECK BLOOD SUGAR AS DIRECTED   Accu-Chek Softclix Lancets lancets 1 each by Other route 3 (three) times daily.   albuterol  108 (90 Base) MCG/ACT inhaler Commonly known as: VENTOLIN  HFA 2 PUFFS EVERY 6 HOURS AS NEEDED FOR WHEEZING OR SHORTNESS OF BREATH   azelastine  0.1 % nasal spray Commonly known as: ASTELIN  2 SPRAYS IN EACH NOSTRIL 2 TIMES A DAY   clopidogrel  75 MG tablet Commonly known as: PLAVIX  Take 1 tablet (75 mg total) by mouth daily.   finasteride  5 MG tablet Commonly known as: Proscar  Take 1 tablet (5 mg total) by mouth daily.   fluticasone  50 MCG/ACT nasal spray Commonly known as: FLONASE  1 SPRAY IN EACH NOSTRIL DAILY AS NEEDED FOR ALLERGIES   FreeStyle Libre 3 Reader Devi 1 each by Does not apply route 4 (four) times daily.   FreeStyle Libre 3 Sensor Misc 1 each by Does not apply route every 14 (fourteen) days. Place 1 sensor on the skin every 14 days. Use to check glucose continuously   gabapentin  100 MG capsule Commonly known as: NEURONTIN  Take 1 capsule (100 mg total) by mouth at bedtime.   [employer] Blood Glucose Test test strip Generic drug: glucose blood Use to check blood sugar as  directed with insulin 3 times a day & for symptoms of high or low blood sugar.   Lantus SoloStar 100 UNIT/ML Solostar Pen Generic drug: insulin glargine Inject 0.05 mLs into the skin daily.   pravastatin  40 MG tablet Commonly known as: PRAVACHOL  Take 1 tablet (40 mg total) by mouth daily.   tamsulosin  0.4 MG Caps capsule Commonly known as: FLOMAX  Take 1 capsule (0.4 mg total) by mouth 2 (two) times daily.   UltiGuard SafePack Pen Needle 32G X 4 MM Misc Generic drug: Insulin Pen Needle Use with insulin up to 4 times/day as needed.         Objective:   BP (!) 149/68    Pulse 98   Ht 5' 5 (1.651 m)   Wt 167 lb (75.8 kg)   SpO2 93%   BMI 27.79 kg/m   Wt Readings from Last 3 Encounters:  11/05/23 167 lb (75.8 kg)  10/09/23 171 lb (77.6 kg)  09/02/23 176 lb (79.8 kg)    Physical Exam Vitals and nursing note reviewed.  Constitutional:      General: He is not in acute distress.    Appearance: Normal appearance.  HENT:     Head: Normocephalic and atraumatic.     Right Ear: External ear normal.     Left Ear: External ear normal.  Eyes:     Conjunctiva/sclera: Conjunctivae normal.  Cardiovascular:     Rate and Rhythm: Normal rate and regular rhythm.     Heart sounds: Normal heart sounds.  Pulmonary:     Effort: Pulmonary effort is normal.     Breath sounds: Normal breath sounds. No wheezing or rales.  Abdominal:     General: Abdomen is flat. There is no distension.     Palpations: Abdomen is soft.     Tenderness: There is no abdominal tenderness. There is no right CVA tenderness or left CVA tenderness.  Musculoskeletal:     Cervical back: Normal range of motion.     Right lower leg: No edema.     Left lower leg: No edema.  Skin:    General: Skin is warm and dry.  Neurological:     Mental Status: He is alert and oriented to person, place, and time.  Psychiatric:        Mood and Affect: Mood normal.        Behavior: Behavior normal.        Thought Content: Thought content normal.        Judgment: Judgment normal.     Assessment & Plan:   Problem List Items Addressed This Visit       Endocrine   Diabetes mellitus (HCC) - Primary   Relevant Orders   Urinalysis, Complete (Completed)   Urine Culture (Completed)    Type 2 diabetes with hyperglycemia   Since fluctuations in blood glucose started after changing his Libre sensor, suspect the sensor may be inaccurate. Provided the patient with a replacement Libre sensor, and he is to contact us  if blood glucose does not improve with new sensor. Do not think infection is playing a role,  but will complete urinalysis today to double check.  Follow up plan: Return if symptoms worsen or fail to improve.  Counseling provided for all of the vaccine components Orders Placed This Encounter  Procedures   Urine Culture   Urinalysis, Complete    Vernell Music, Medical Student [facility] 11/05/2023, 4:42 PM  Patient seen and examined with Vernell Music, medical student.  His blood sugar fluctuations have been happening a lot more since he changed out his sensor.  He had been pretty controlled and doing well, more suspicious that it is a sensor malfunction and we had a sample so gave him a sample sensor that he can change out and watch it over the next few days and let us  know. Fonda Levins, MD [facility] 11/06/2023, 8:10 AM

## 2023-11-06 ENCOUNTER — Other Ambulatory Visit: Payer: Self-pay

## 2023-11-06 LAB — URINALYSIS, COMPLETE
Bilirubin, UA: NEGATIVE
Glucose, UA: NEGATIVE
Ketones, UA: NEGATIVE
Leukocytes,UA: NEGATIVE
Nitrite, UA: NEGATIVE
Protein,UA: NEGATIVE
Specific Gravity, UA: 1.015 (ref 1.005–1.030)
Urobilinogen, Ur: 1 mg/dL (ref 0.2–1.0)
pH, UA: 6 (ref 5.0–7.5)

## 2023-11-06 MED ORDER — BLOOD GLUCOSE MONITORING SUPPL DEVI: 1.0000 | Freq: Three times a day (TID) | 0 refills | Status: AC

## 2023-11-06 MED ORDER — LANCETS MISC. MISC: 1.0000 | Freq: Three times a day (TID) | 0 refills | Status: AC

## 2023-11-06 MED ORDER — LANCET DEVICE MISC: 1.0000 | Freq: Three times a day (TID) | 0 refills | Status: AC

## 2023-11-06 MED ORDER — BLOOD GLUCOSE TEST VI STRP: 1.0000 | ORAL_STRIP | Freq: Three times a day (TID) | 0 refills | Status: AC

## 2023-11-08 LAB — URINE CULTURE

## 2023-11-20 ENCOUNTER — Telehealth: Payer: Self-pay | Admitting: Family Medicine

## 2023-11-20 NOTE — Telephone Encounter (Signed)
Copied from CRM [id number]. Topic: Clinical - Prescription Issue >> Nov 20, 2023  1:03 PM Ivette P wrote: Reason for CRM: Everlean Alstrom called in for pt stating that insurance was updated and prescriptions would now need to go to [facility] listed below:  [id number]  [address] [place], [place] [address] Phone: [phone]  Can call Everlean Alstrom for any questions [phone]

## 2023-11-20 NOTE — Telephone Encounter (Signed)
Pharmacy has been updated.

## 2023-12-02 ENCOUNTER — Ambulatory Visit: Payer: 59 | Admitting: Nurse Practitioner

## 2023-12-23 ENCOUNTER — Ambulatory Visit (INDEPENDENT_AMBULATORY_CARE_PROVIDER_SITE_OTHER): Payer: No Typology Code available for payment source

## 2023-12-23 VITALS — Ht 65.0 in | Wt 167.0 lb

## 2023-12-23 DIAGNOSIS — Z0000 Encounter for general adult medical examination without abnormal findings: Secondary | ICD-10-CM

## 2023-12-23 DIAGNOSIS — Z1211 Encounter for screening for malignant neoplasm of colon: Secondary | ICD-10-CM

## 2023-12-23 NOTE — Progress Notes (Signed)
 Subjective:   Colin Mitchell is a 73 y.o. who presents for a Medicare Wellness preventive visit.  Visit Complete: Virtual I connected with  Colin Mitchell on 12/23/23 by a audio enabled telemedicine application and verified that I am speaking with the correct person using two identifiers.  Patient Location: Home  Provider Location: Home Office  I discussed the limitations of evaluation and management by telemedicine. The patient expressed understanding and agreed to proceed.  Vital Signs: Because this visit was a virtual/telehealth visit, some criteria may be missing or patient reported. Any vitals not documented were not able to be obtained and vitals that have been documented are patient reported.  VideoDeclined- This patient declined Librarian, academic. Therefore the visit was completed with audio only.  AWV Questionnaire: No: Patient Medicare AWV questionnaire was not completed prior to this visit.  Cardiac Risk Factors include: advanced age (>15men, >66 women);hypertension;dyslipidemia;diabetes mellitus;male gender     Objective:    Today's Vitals   12/23/23 1354  Weight: 167 lb (75.8 kg)  Height: 5\' 5"  (1.651 m)   Body mass index is 27.79 kg/m.     12/23/2023    4:42 PM 12/19/2022   11:25 AM 10/14/2021    9:09 AM 06/13/2020    9:43 AM 06/07/2019    9:58 AM  Advanced Directives  Does Patient Have a Medical Advance Directive? Yes Yes No;Yes Yes No  Type of Estate agent of [place];Living will Healthcare Power of [place];Living will Healthcare Power of [place];Living will Out of facility DNR (pink MOST or yellow form)   Does patient want to make changes to medical advance directive? No - Patient declined No - Patient declined  No - Patient declined   Copy of Healthcare Power of Attorney in Chart? Yes - validated most recent copy scanned in chart (See row information) Yes - validated most recent copy scanned in chart  (See row information) Yes - validated most recent copy scanned in chart (See row information)    Would patient like information on creating a medical advance directive?   No - Patient declined  No - Patient declined    Current Medications (verified) Outpatient Encounter Medications as of 12/23/2023  Medication Sig   Accu-Chek Softclix Lancets lancets 1 each by Other route 3 (three) times daily.   albuterol (VENTOLIN HFA) 108 (90 Base) MCG/ACT inhaler 2 PUFFS EVERY 6 HOURS AS NEEDED FOR WHEEZING OR SHORTNESS OF BREATH   azelastine (ASTELIN) 0.1 % nasal spray 2 SPRAYS IN EACH NOSTRIL 2 TIMES A DAY   Blood Glucose Monitoring Suppl (ACCU-CHEK GUIDE) w/Device KIT CHECK BLOOD SUGAR AS DIRECTED   Blood Glucose Monitoring Suppl DEVI 1 each by Does not apply route in the morning, at noon, and at bedtime. May substitute to any manufacturer covered by patient's insurance.   clopidogrel (PLAVIX) 75 MG tablet Take 1 tablet (75 mg total) by mouth daily.   Continuous Glucose Receiver (FREESTYLE LIBRE 3 READER) DEVI 1 each by Does not apply route 4 (four) times daily.   Continuous Glucose Sensor (FREESTYLE LIBRE 3 SENSOR) MISC 1 each by Does not apply route every 14 (fourteen) days. Place 1 sensor on the skin every 14 days. Use to check glucose continuously   finasteride (PROSCAR) 5 MG tablet Take 1 tablet (5 mg total) by mouth daily.   fluticasone (FLONASE) 50 MCG/ACT nasal spray 1 SPRAY IN EACH NOSTRIL DAILY AS NEEDED FOR ALLERGIES   gabapentin (NEURONTIN) 100 MG capsule Take 1  capsule (100 mg total) by mouth at bedtime.   [employer] BLOOD GLUCOSE TEST test strip Use to check blood sugar as directed with insulin 3 times a day & for symptoms of high or low blood sugar.   pravastatin (PRAVACHOL) 40 MG tablet Take 1 tablet (40 mg total) by mouth daily.   tamsulosin (FLOMAX) 0.4 MG CAPS capsule Take 1 capsule (0.4 mg total) by mouth 2 (two) times daily.   ULTIGUARD SAFEPACK PEN NEEDLE 32G X 4 MM MISC Use with insulin  up to 4 times/day as needed.   LANTUS SOLOSTAR 100 UNIT/ML Solostar Pen Inject 0.05 mLs into the skin daily.   No facility-administered encounter medications on file as of 12/23/2023.    Allergies (verified) Sulfa antibiotics, Aspirin, Ibuprofen, and Penicillins   History: Past Medical History:  Diagnosis Date   Allergic rhinitis    Benign prostatic hypertrophy    CAD (coronary artery disease)    Cancer (HCC) 2006, 2011   SCCA of the Uvula; S/P radiation therapy 06/09/05 thru 07/31/05; H/O recurrence in 2011 and S/P resection on 08/22/10.   Degenerative disc disease    Depression    H/O depression   Detached retina, left    H/O Detached retina of left eye with visual deficit   HLD (hyperlipidemia)    Seizures (HCC)    H/O seizures secondary to alcohol withdrawal   Sickle cell anemia (HCC)    only in his eyes   Throat cancer (HCC)    Thrombocytopenia (HCC)    H/O thrombocytopenia   Past Surgical History:  Procedure Laterality Date   PILONIDAL CYST EXCISION  remote   THROAT SURGERY  2015   Family History  Problem Relation Age of Onset   Cancer Mother        breast    Diabetes Mother    Hypertension Mother    Cancer Father    Cancer Brother        kidney   Social History   Socioeconomic History   Marital status: Divorced    Spouse name: Not on file   Number of children: 1   Years of education: Not on file   Highest education level: GED or equivalent  Occupational History   Occupation: retired  Tobacco Use   Smoking status: Former    Current packs/day: 0.00    Average packs/day: 1 pack/day for 60.0 years (60.0 ttl pk-yrs)    Types: Cigarettes    Quit date: 01/02/2023    Years since quitting: 0.9   Smokeless tobacco: Never  Vaping Use   Vaping status: Never Used  Substance and Sexual Activity   Alcohol use: Yes    Alcohol/week: 4.0 standard drinks of alcohol    Types: 4 Cans of beer per week    Comment: 4 cans per week   Drug use: Never   Sexual activity:  Not Currently  Other Topics Concern   Not on file  Social History Narrative   Not on file   Social Drivers of Health   Financial Resource Strain: Low Risk  (12/23/2023)   Overall Financial Resource Strain (CARDIA)    Difficulty of Paying Living Expenses: Not hard at all  Food Insecurity: No Food Insecurity (12/23/2023)   Hunger Vital Sign    Worried About Running Out of Food in the Last Year: Never true    Ran Out of Food in the Last Year: Never true  Transportation Needs: No Transportation Needs (12/23/2023)   PRAPARE - Transportation  Lack of Transportation (Medical): No    Lack of Transportation (Non-Medical): No  Physical Activity: Insufficiently Active (12/23/2023)   Exercise Vital Sign    Days of Exercise per Week: 3 days    Minutes of Exercise per Session: 30 min  Stress: No Stress Concern Present (12/23/2023)   [employer] of Occupational Health - Occupational Stress Questionnaire    Feeling of Stress : Not at all  Social Connections: Socially Isolated (12/23/2023)   Social Connection and Isolation Panel [NHANES]    Frequency of Communication with Friends and Family: More than three times a week    Frequency of Social Gatherings with Friends and Family: Three times a week    Attends Religious Services: Never    Active Member of Clubs or Organizations: No    Attends Engineer, structural: Never    Marital Status: Divorced    Tobacco Counseling Counseling given: Not Answered    Clinical Intake:  Pre-visit preparation completed: Yes  Pain : No/denies pain     Diabetes: Yes CBG done?: No Did pt. bring in CBG monitor from home?: No  How often do you need to have someone help you when you read instructions, pamphlets, or other written materials from your doctor or pharmacy?: 1 - Never  Interpreter Needed?: No  Comments: Assisted with visit by significant other Information entered by :: Kandis Fantasia LPN   Activities of Daily Living      12/23/2023    4:36 PM  In your present state of health, do you have any difficulty performing the following activities:  Hearing? 0  Vision? 0  Difficulty concentrating or making decisions? 0  Walking or climbing stairs? 0  Dressing or bathing? 0  Doing errands, shopping? 0  Preparing Food and eating ? N  Using the Toilet? N  In the past six months, have you accidently leaked urine? N  Do you have problems with loss of bowel control? N  Managing your Medications? N  Managing your Finances? N  Housekeeping or managing your Housekeeping? N    Patient Care Team: Dettinger, Elige Radon, MD as PCP - General (Family Medicine)  Indicate any recent Medical Services you may have received from other than [facility] providers in the past year (date may be approximate).     Assessment:   This is a routine wellness examination for [employer].  Hearing/Vision screen Hearing Screening - Comments:: Denies hearing difficulties   Vision Screening - Comments:: No vision problems; will schedule routine eye exam soon     Goals Addressed   None    Depression Screen     12/23/2023    4:40 PM 11/05/2023    4:14 PM 10/09/2023    9:36 AM 09/02/2023   10:55 AM 07/10/2023    8:25 AM 04/03/2023    8:41 AM 12/29/2022   11:10 AM  PHQ 2/9 Scores  PHQ - 2 Score 2 2 2  0 0 0 0  PHQ- 9 Score 8 8 8  0       Fall Risk     12/23/2023    4:44 PM 11/05/2023    4:13 PM 10/09/2023    9:36 AM 09/02/2023   10:55 AM 07/10/2023    8:25 AM  Fall Risk   Falls in the past year? 0 0 0 0 0  Number falls in past yr: 0      Injury with Fall? 0      Risk for fall due to : No Fall Risks  Follow up Falls prevention discussed;Education provided;Falls evaluation completed        MEDICARE RISK AT HOME:  Medicare Risk at Home Any stairs in or around the home?: No If so, are there any without handrails?: No Home free of loose throw rugs in walkways, pet beds, electrical cords, etc?: Yes Adequate lighting in your home to reduce risk  of falls?: Yes Life alert?: No Use of a cane, walker or w/c?: No Grab bars in the bathroom?: Yes Shower chair or bench in shower?: No Elevated toilet seat or a handicapped toilet?: Yes  TIMED UP AND GO:  Was the test performed?  No  Cognitive Function: 6CIT completed        12/23/2023    4:45 PM 12/19/2022   11:25 AM 10/14/2021    9:14 AM 06/13/2020    9:46 AM 06/07/2019   10:03 AM  6CIT Screen  What Year? 0 points 0 points 0 points 0 points 0 points  What month? 0 points 0 points 0 points 0 points 0 points  What time? 0 points 0 points 0 points 0 points 0 points  Count back from 20 0 points 0 points 0 points 0 points 0 points  Months in reverse 2 points 0 points 0 points 0 points 0 points  Repeat phrase 0 points 2 points 0 points 2 points 0 points  Total Score 2 points 2 points 0 points 2 points 0 points    Immunizations Immunization History  Administered Date(s) Administered   Fluad Quad(high Dose 65+) 08/21/2022   Fluad Trivalent(High Dose 65+) 09/08/2023   Moderna Covid-19 Fall Seasonal Vaccine 9yrs & older 09/08/2023   Moderna Sars-Covid-2 Vaccination 08/22/2020, 12/17/2021   PFIZER(Purple Top)SARS-COV-2 Vaccination 12/22/2019, 01/20/2020   Pfizer(Comirnaty)Fall Seasonal Vaccine 12 years and older 08/05/2022   Pneumococcal Conjugate-13 03/15/2018   Unspecified SARS-COV-2 Vaccination 08/05/2022   Zoster Recombinant(Shingrix) 12/29/2022    Screening Tests Health Maintenance  Topic Date Due   Lung Cancer Screening  04/18/2006   Zoster Vaccines- Shingrix (2 of 2) 02/23/2023   COVID-19 Vaccine (7 - 2024-25 season) 11/03/2023   Diabetic kidney evaluation - Urine ACR  12/29/2023   Pneumonia Vaccine 46+ Years old (2 of 2 - PPSV23 or PCV20) 04/02/2024 (Originally 05/10/2018)   DTaP/Tdap/Td (1 - Tdap) 09/01/2024 (Originally 08/04/1970)   Colonoscopy  09/01/2024 (Originally 08/28/2019)   FOOT EXAM  12/29/2023   HEMOGLOBIN A1C  04/08/2024   Diabetic kidney evaluation -  eGFR measurement  09/01/2024   Medicare Annual Wellness (AWV)  12/22/2024   INFLUENZA VACCINE  Completed   Hepatitis C Screening  Completed   HPV VACCINES  Aged Out   OPHTHALMOLOGY EXAM  Discontinued    Health Maintenance  Health Maintenance Due  Topic Date Due   Lung Cancer Screening  04/18/2006   Zoster Vaccines- Shingrix (2 of 2) 02/23/2023   COVID-19 Vaccine (7 - 2024-25 season) 11/03/2023   Diabetic kidney evaluation - Urine ACR  12/29/2023   Health Maintenance Items Addressed: Referral sent to GI for colonoscopy  Additional Screening:  Vision Screening: Recommended annual ophthalmology exams for early detection of glaucoma and other disorders of the eye.  Dental Screening: Recommended annual dental exams for proper oral hygiene  Community Resource Referral / Chronic Care Management: CRR required this visit?  No   CCM required this visit?  No     Plan:     I have personally reviewed and noted the following in the patient's chart:   Medical and social history Use  of alcohol, tobacco or illicit drugs  Current medications and supplements including opioid prescriptions. Patient is not currently taking opioid prescriptions. Functional ability and status Nutritional status Physical activity Advanced directives List of other physicians Hospitalizations, surgeries, and ER visits in previous 12 months Vitals Screenings to include cognitive, depression, and falls Referrals and appointments  In addition, I have reviewed and discussed with patient certain preventive protocols, quality metrics, and best practice recommendations. A written personalized care plan for preventive services as well as general preventive health recommendations were provided to patient.     Kandis Fantasia [place], [place]   1/61/0960   After Visit Summary: (Pick Up) Due to this being a telephonic visit, with patients personalized plan was offered to patient and patient has requested to Pick up  at office.  Notes: Nothing significant to report at this time.

## 2023-12-23 NOTE — Patient Instructions (Signed)
 Mr. Colin Mitchell , Thank you for taking time to come for your Medicare Wellness Visit. I appreciate your ongoing commitment to your health goals. Please review the following plan we discussed and let me know if I can assist you in the future.   Referrals/Orders/Follow-Ups/Clinician Recommendations: Aim for 30 minutes of exercise or brisk walking, 6-8 glasses of water, and 5 servings of fruits and vegetables each day.  This is a list of the screening recommended for you and due dates:  Health Maintenance  Topic Date Due   Screening for Lung Cancer  04/18/2006   Zoster (Shingles) Vaccine (2 of 2) 02/23/2023   COVID-19 Vaccine (7 - 2024-25 season) 11/03/2023   Yearly kidney health urinalysis for diabetes  12/29/2023   Pneumonia Vaccine (2 of 2 - PPSV23 or PCV20) 04/02/2024*   DTaP/Tdap/Td vaccine (1 - Tdap) 09/01/2024*   Colon Cancer Screening  09/01/2024*   Complete foot exam   12/29/2023   Hemoglobin A1C  04/08/2024   Yearly kidney function blood test for diabetes  09/01/2024   Medicare Annual Wellness Visit  12/22/2024   Flu Shot  Completed   Hepatitis C Screening  Completed   HPV Vaccine  Aged Out   Eye exam for diabetics  Discontinued  *Topic was postponed. The date shown is not the original due date.    Advanced directives: (In Chart) A copy of your advanced directives are scanned into your chart should your provider ever need it.  Next Medicare Annual Wellness Visit scheduled for next year: Yes

## 2023-12-31 ENCOUNTER — Encounter: Payer: Self-pay | Admitting: *Deleted

## 2024-01-01 ENCOUNTER — Telehealth: Payer: Self-pay | Admitting: Pharmacist

## 2024-01-01 NOTE — Telephone Encounter (Signed)
   Patient was identified as falling into the [place] Measure - Diabetes.   Patient was: Appointment scheduled with primary care provider in the next 30 days.   Patient also very engaged with endo.  Will touch base with PCP On 01/11/24 to see if pharmD intervention is needed.   Kieth Brightly, PharmD, BCACP, CPP Clinical Pharmacist, [facility]

## 2024-01-02 ENCOUNTER — Other Ambulatory Visit: Payer: Self-pay | Admitting: Family Medicine

## 2024-01-02 DIAGNOSIS — R062 Wheezing: Secondary | ICD-10-CM

## 2024-01-11 ENCOUNTER — Encounter: Payer: Self-pay | Admitting: Family Medicine

## 2024-01-11 ENCOUNTER — Ambulatory Visit (INDEPENDENT_AMBULATORY_CARE_PROVIDER_SITE_OTHER): Payer: 59 | Admitting: Family Medicine

## 2024-01-11 VITALS — BP 90/57 | HR 75 | Ht 65.0 in | Wt 161.0 lb

## 2024-01-11 DIAGNOSIS — E1169 Type 2 diabetes mellitus with other specified complication: Secondary | ICD-10-CM

## 2024-01-11 DIAGNOSIS — N400 Enlarged prostate without lower urinary tract symptoms: Secondary | ICD-10-CM

## 2024-01-11 DIAGNOSIS — Z87891 Personal history of nicotine dependence: Secondary | ICD-10-CM | POA: Diagnosis not present

## 2024-01-11 DIAGNOSIS — Z122 Encounter for screening for malignant neoplasm of respiratory organs: Secondary | ICD-10-CM | POA: Diagnosis not present

## 2024-01-11 DIAGNOSIS — E1149 Type 2 diabetes mellitus with other diabetic neurological complication: Secondary | ICD-10-CM

## 2024-01-11 DIAGNOSIS — E782 Mixed hyperlipidemia: Secondary | ICD-10-CM | POA: Diagnosis not present

## 2024-01-11 LAB — BAYER DCA HB A1C WAIVED: HB A1C (BAYER DCA - WAIVED): 6.2 % — ABNORMAL HIGH (ref 4.8–5.6)

## 2024-01-11 LAB — LIPID PANEL

## 2024-01-11 MED ORDER — TAMSULOSIN HCL 0.4 MG PO CAPS: 0.4000 mg | ORAL_CAPSULE | Freq: Two times a day (BID) | ORAL | 3 refills | Status: AC

## 2024-01-11 MED ORDER — PRAVASTATIN SODIUM 40 MG PO TABS: 40.0000 mg | ORAL_TABLET | Freq: Every day | ORAL | 3 refills | Status: DC

## 2024-01-11 MED ORDER — FINASTERIDE 5 MG PO TABS: 5.0000 mg | ORAL_TABLET | Freq: Every day | ORAL | 3 refills | Status: AC

## 2024-01-11 MED ORDER — GABAPENTIN 100 MG PO CAPS: 100.0000 mg | ORAL_CAPSULE | Freq: Every day | ORAL | 3 refills | Status: AC

## 2024-01-11 MED ORDER — CLOPIDOGREL BISULFATE 75 MG PO TABS: 75.0000 mg | ORAL_TABLET | Freq: Every day | ORAL | 3 refills | Status: AC

## 2024-01-11 NOTE — Progress Notes (Signed)
 BP (!) 90/57   Pulse 75   Ht 5\' 5"  (1.651 m)   Wt 161 lb (73 kg)   SpO2 96%   BMI 26.79 kg/m    Subjective:   Patient ID: Colin Mitchell, male    DOB: 09/24/1951, 73 y.o.   MRN: [id number]  HPI: Colin Mitchell is a 73 y.o. male presenting on 01/11/2024 for Medical Management of Chronic Issues, Diabetes, and Hyperlipidemia   HPI Type 2 diabetes mellitus Patient comes in today for recheck of his diabetes. Patient has been currently taking Lantus, although per his wife he has not been needing it recently and she is keeping an eye on his blood sugars.. Patient is not currently on an ACE inhibitor/ARB. Patient has not seen an ophthalmologist this year. Patient denies any new issues with their feet. The symptom started onset as an adult hyperlipidemia ARE RELATED TO DM   Hyperlipidemia Patient is coming in for recheck of his hyperlipidemia. The patient is currently taking pravastatin. They deny any issues with myalgias or history of liver damage from it. They deny any focal numbness or weakness or chest pain.   BPH Patient is coming in for recheck on BPH Symptoms: Urinary frequency Medication: Finasteride and Flomax Last PSA: Just under a year ago  Relevant past medical, surgical, family and social history reviewed and updated as indicated. Interim medical history since our last visit reviewed. Allergies and medications reviewed and updated.  Review of Systems  Constitutional:  Negative for chills and fever.  Eyes:  Negative for visual disturbance.  Respiratory:  Negative for shortness of breath and wheezing.   Cardiovascular:  Negative for chest pain and leg swelling.  Musculoskeletal:  Negative for back pain and gait problem.  Skin:  Negative for rash.  Neurological:  Negative for dizziness and light-headedness.  All other systems reviewed and are negative.   Per HPI unless specifically indicated above   Allergies as of 01/11/2024       Reactions   Sulfa Antibiotics     Aspirin Rash   Ibuprofen Rash   Penicillins Rash        Medication List        Accurate as of January 11, 2024  8:44 AM. If you have any questions, ask your nurse or doctor.          Accu-Chek Guide w/Device Kit CHECK BLOOD SUGAR AS DIRECTED   Blood Glucose Monitoring Suppl Devi 1 each by Does not apply route in the morning, at noon, and at bedtime. May substitute to any manufacturer covered by patient's insurance.   Accu-Chek Softclix Lancets lancets 1 each by Other route 3 (three) times daily.   albuterol 108 (90 Base) MCG/ACT inhaler Commonly known as: VENTOLIN HFA INHALE 2 PUFFS EVERY 6 HOURS AS NEEDED FOR WHEEZING OR SHORTNESS OF BREATH   azelastine 0.1 % nasal spray Commonly known as: ASTELIN 2 SPRAYS IN EACH NOSTRIL 2 TIMES A DAY   clopidogrel 75 MG tablet Commonly known as: PLAVIX Take 1 tablet (75 mg total) by mouth daily.   finasteride 5 MG tablet Commonly known as: Proscar Take 1 tablet (5 mg total) by mouth daily.   fluticasone 50 MCG/ACT nasal spray Commonly known as: FLONASE 1 SPRAY IN EACH NOSTRIL DAILY AS NEEDED FOR ALLERGIES   FreeStyle Libre 3 Reader Devi 1 each by Does not apply route 4 (four) times daily.   FreeStyle Libre 3 Sensor Misc 1 each by Does not apply route every 14 (fourteen)  days. Place 1 sensor on the skin every 14 days. Use to check glucose continuously   gabapentin 100 MG capsule Commonly known as: NEURONTIN Take 1 capsule (100 mg total) by mouth at bedtime.   [employer] Blood Glucose Test test strip Generic drug: glucose blood Use to check blood sugar as directed with insulin 3 times a day & for symptoms of high or low blood sugar.   Lantus SoloStar 100 UNIT/ML Solostar Pen Generic drug: insulin glargine Inject 0.05 mLs into the skin daily.   pravastatin 40 MG tablet Commonly known as: PRAVACHOL Take 1 tablet (40 mg total) by mouth daily.   tamsulosin 0.4 MG Caps capsule Commonly known as: FLOMAX Take 1 capsule  (0.4 mg total) by mouth 2 (two) times daily.   UltiGuard SafePack Pen Needle 32G X 4 MM Misc Generic drug: Insulin Pen Needle Use with insulin up to 4 times/day as needed.         Objective:   BP (!) 90/57   Pulse 75   Ht 5\' 5"  (1.651 m)   Wt 161 lb (73 kg)   SpO2 96%   BMI 26.79 kg/m   Wt Readings from Last 3 Encounters:  01/11/24 161 lb (73 kg)  12/23/23 167 lb (75.8 kg)  11/05/23 167 lb (75.8 kg)    Physical Exam Vitals and nursing note reviewed.  Constitutional:      General: He is not in acute distress.    Appearance: He is well-developed. He is not diaphoretic.  Eyes:     General: No scleral icterus.    Conjunctiva/sclera: Conjunctivae normal.  Neck:     Thyroid: No thyromegaly.  Cardiovascular:     Rate and Rhythm: Normal rate and regular rhythm.     Heart sounds: Normal heart sounds. No murmur heard. Pulmonary:     Effort: Pulmonary effort is normal. No respiratory distress.     Breath sounds: Normal breath sounds. No wheezing.  Musculoskeletal:        General: No swelling. Normal range of motion.     Cervical back: Neck supple.  Lymphadenopathy:     Cervical: No cervical adenopathy.  Skin:    General: Skin is warm and dry.     Findings: No rash.  Neurological:     Mental Status: He is alert and oriented to person, place, and time.     Coordination: Coordination normal.  Psychiatric:        Behavior: Behavior normal.       Assessment & Plan:   Problem List Items Addressed This Visit       Endocrine   Diabetes mellitus (HCC) - Primary   Relevant Medications   gabapentin (NEURONTIN) 100 MG capsule   pravastatin (PRAVACHOL) 40 MG tablet   Other Relevant Orders   CBC with Differential/Platelet   CMP14+EGFR   Lipid panel   Bayer DCA Hb A1c Waived   Microalbumin / creatinine urine ratio     Genitourinary   BPH (benign prostatic hyperplasia)   Relevant Medications   finasteride (PROSCAR) 5 MG tablet   tamsulosin (FLOMAX) 0.4 MG CAPS  capsule     Other   Hyperlipemia   Relevant Medications   pravastatin (PRAVACHOL) 40 MG tablet   Other Relevant Orders   CBC with Differential/Platelet   CMP14+EGFR   Lipid panel   Bayer DCA Hb A1c Waived   Other Visit Diagnoses       Screening for lung cancer       Relevant Orders  Ambulatory Referral for Lung Cancer Scre     Stopped smoking with greater than 30 pack year history       Relevant Orders   Ambulatory Referral for Lung Cancer Scre     Other diabetic neurological complication associated with type 2 diabetes mellitus (HCC)       Relevant Medications   finasteride (PROSCAR) 5 MG tablet   pravastatin (PRAVACHOL) 40 MG tablet       A1c is 6.2.  Blood pressure is on the lower side and he is little unsteady, just encourage hydration.  The only medicine that he is on currently that would lower his blood pressure is Flomax but he has so much urinary symptoms that we will keep it for now and encouraged hydration. Follow up plan: Return in about 3 months (around 04/12/2024), or if symptoms worsen or fail to improve, for Diabetes and hyperlipidemia.  Counseling provided for all of the vaccine components Orders Placed This Encounter  Procedures   CBC with Differential/Platelet   CMP14+EGFR   Lipid panel   Bayer DCA Hb A1c Waived   Microalbumin / creatinine urine ratio   Ambulatory Referral for Lung Cancer Scre    Arville Care, MD [facility] 01/11/2024, 8:44 AM

## 2024-01-12 LAB — CBC WITH DIFFERENTIAL/PLATELET
Basophils Absolute: 0 10*3/uL (ref 0.0–0.2)
Basos: 0 %
EOS (ABSOLUTE): 0.1 10*3/uL (ref 0.0–0.4)
Eos: 3 %
Hematocrit: 41.1 % (ref 37.5–51.0)
Hemoglobin: 13 g/dL (ref 13.0–17.7)
Immature Grans (Abs): 0 10*3/uL (ref 0.0–0.1)
Immature Granulocytes: 0 %
Lymphocytes Absolute: 0.7 10*3/uL (ref 0.7–3.1)
Lymphs: 28 %
MCH: 25.7 pg — ABNORMAL LOW (ref 26.6–33.0)
MCHC: 31.6 g/dL (ref 31.5–35.7)
MCV: 81 fL (ref 79–97)
Monocytes Absolute: 0.3 10*3/uL (ref 0.1–0.9)
Monocytes: 12 %
Neutrophils Absolute: 1.3 10*3/uL — ABNORMAL LOW (ref 1.4–7.0)
Neutrophils: 57 %
Platelets: 168 10*3/uL (ref 150–450)
RBC: 5.06 x10E6/uL (ref 4.14–5.80)
RDW: 15.4 % (ref 11.6–15.4)
WBC: 2.3 10*3/uL — CL (ref 3.4–10.8)

## 2024-01-12 LAB — CMP14+EGFR
ALT: 9 IU/L (ref 0–44)
AST: 14 IU/L (ref 0–40)
Albumin: 4.6 g/dL (ref 3.8–4.8)
Alkaline Phosphatase: 53 IU/L (ref 44–121)
BUN/Creatinine Ratio: 13 (ref 10–24)
BUN: 13 mg/dL (ref 8–27)
Bilirubin Total: 0.4 mg/dL (ref 0.0–1.2)
CO2: 23 mmol/L (ref 20–29)
Calcium: 9.8 mg/dL (ref 8.6–10.2)
Chloride: 102 mmol/L (ref 96–106)
Creatinine, Ser: 0.99 mg/dL (ref 0.76–1.27)
Globulin, Total: 2.6 g/dL (ref 1.5–4.5)
Glucose: 120 mg/dL — ABNORMAL HIGH (ref 70–99)
Potassium: 4.5 mmol/L (ref 3.5–5.2)
Sodium: 140 mmol/L (ref 134–144)
Total Protein: 7.2 g/dL (ref 6.0–8.5)
eGFR: 81 mL/min/{1.73_m2} (ref >59)

## 2024-01-12 LAB — LIPID PANEL
Cholesterol, Total: 169 mg/dL (ref 100–199)
HDL: 45 mg/dL (ref >39)
LDL CALC COMMENT:: 3.8 ratio (ref 0.0–5.0)
LDL Chol Calc (NIH): 110 mg/dL — ABNORMAL HIGH (ref 0–99)
Triglycerides: 75 mg/dL (ref 0–149)
VLDL Cholesterol Cal: 14 mg/dL (ref 5–40)

## 2024-01-12 LAB — MICROALBUMIN / CREATININE URINE RATIO
Creatinine, Urine: 83.2 mg/dL
Microalb/Creat Ratio: 5 mg/g{creat} (ref 0–29)
Microalbumin, Urine: 4.4 ug/mL

## 2024-01-19 ENCOUNTER — Other Ambulatory Visit: Payer: Self-pay

## 2024-01-19 DIAGNOSIS — D729 Disorder of white blood cells, unspecified: Secondary | ICD-10-CM

## 2024-01-26 ENCOUNTER — Other Ambulatory Visit: Payer: Self-pay

## 2024-01-26 ENCOUNTER — Telehealth: Payer: Self-pay | Admitting: Acute Care

## 2024-01-26 DIAGNOSIS — Z122 Encounter for screening for malignant neoplasm of respiratory organs: Secondary | ICD-10-CM

## 2024-01-26 DIAGNOSIS — Z87891 Personal history of nicotine dependence: Secondary | ICD-10-CM

## 2024-01-26 NOTE — Telephone Encounter (Signed)
 Lung Cancer Screening Narrative/Criteria Questionnaire (Cigarette Smokers Only- No Cigars/Pipes/vapes)   Colin Mitchell   SDMV:02/29/24 at 10am/ [facility]                                           [birth year]                         LDCT: 03/01/24 at 1030a/ [facility]    72 y.o.   Phone: [phone]  Lung Screening Narrative (confirm age 103-77 yrs Medicare / 50-80 yrs Private pay insurance)   Insurance information:[employer]   Referring Provider:[facility]   This screening involves an initial phone call with a team member from our program. It is called a shared decision making visit. The initial meeting is required by insurance and Medicare to make sure you understand the program. This appointment takes about 15-20 minutes to complete. The CT scan will completed at a separate date/time. This scan takes about 5-10 minutes to complete and you may eat and drink before and after the scan.  Criteria questions for Lung Cancer Screening:   Are you a current or former smoker? Former Age began smoking: 73 yo   If you are a former smoker, what year did you quit smoking? March 2025   To calculate your smoking history, I need an accurate estimate of how many packs of cigarettes you smoked per day and for how many years. (Not just the number of PPD you are now smoking)   Years smoking 62 x Packs per day 1 = Pack years 62   (at least 20 pack yrs)   (Make sure they understand that we need to know how much they have smoked in the past, not just the number of PPD they are smoking now)  Do you have a personal history of cancer?  Yes - throat cancer 10 years ago    Do you have a family history of cancer? Yes  (cancer type and and relative) mother/breast father - unsure of type    brother /kidney  Are you coughing up blood?  No  Have you had unexplained weight loss of 15 lbs or more in the last 6 months? No  It looks like you meet all criteria.     Additional information: N/A

## 2024-01-27 ENCOUNTER — Other Ambulatory Visit: Payer: Self-pay | Admitting: Family Medicine

## 2024-01-27 ENCOUNTER — Telehealth: Payer: Self-pay | Admitting: Family Medicine

## 2024-01-27 DIAGNOSIS — R062 Wheezing: Secondary | ICD-10-CM

## 2024-02-01 ENCOUNTER — Other Ambulatory Visit: Payer: Self-pay | Admitting: Family Medicine

## 2024-02-01 DIAGNOSIS — E1169 Type 2 diabetes mellitus with other specified complication: Secondary | ICD-10-CM

## 2024-02-01 DIAGNOSIS — E1100 Type 2 diabetes mellitus with hyperosmolarity without nonketotic hyperglycemic-hyperosmolar coma (NKHHC): Secondary | ICD-10-CM

## 2024-02-03 ENCOUNTER — Ambulatory Visit: Admitting: Family Medicine

## 2024-02-16 ENCOUNTER — Ambulatory Visit

## 2024-02-16 NOTE — Progress Notes (Signed)
 Unable to complete diabetic eye exam due to patient's eyes not dilating. Recommended see eye doctor to have this done.

## 2024-02-17 NOTE — Patient Instructions (Signed)

## 2024-02-18 ENCOUNTER — Encounter: Admitting: Oncology

## 2024-02-18 ENCOUNTER — Other Ambulatory Visit

## 2024-02-18 ENCOUNTER — Ambulatory Visit (INDEPENDENT_AMBULATORY_CARE_PROVIDER_SITE_OTHER): Payer: No Typology Code available for payment source | Admitting: Nurse Practitioner

## 2024-02-18 ENCOUNTER — Encounter: Payer: Self-pay | Admitting: Nurse Practitioner

## 2024-02-18 VITALS — BP 124/66 | HR 64 | Ht 65.0 in | Wt 166.2 lb

## 2024-02-18 DIAGNOSIS — E119 Type 2 diabetes mellitus without complications: Secondary | ICD-10-CM | POA: Diagnosis not present

## 2024-02-18 DIAGNOSIS — E782 Mixed hyperlipidemia: Secondary | ICD-10-CM

## 2024-02-18 DIAGNOSIS — I10 Essential (primary) hypertension: Secondary | ICD-10-CM

## 2024-02-18 MED ORDER — FREESTYLE LITE TEST VI STRP: ORAL_STRIP | 12 refills | Status: DC

## 2024-02-18 NOTE — Progress Notes (Signed)
 Endocrinology Consult Note       02/18/2024, 12:08 PM   Subjective:    Patient ID: Colin Mitchell, male    DOB: 02-04-1951.  Colin Mitchell is being seen in consultation for management of currently uncontrolled symptomatic diabetes requested by  Dettinger, Lucio Sabin, MD.   Past Medical History:  Diagnosis Date   Allergic rhinitis    Benign prostatic hypertrophy    CAD (coronary artery disease)    Cancer (HCC) 2006, 2011   SCCA of the [place]; S/P radiation therapy 06/09/05 thru 07/31/05; H/O recurrence in 2011 and S/P resection on 08/22/10.   Degenerative disc disease    Depression    H/O depression   Detached retina, left    H/O Detached retina of left eye with visual deficit   HLD (hyperlipidemia)    Seizures (HCC)    H/O seizures secondary to alcohol withdrawal   Sickle cell anemia (HCC)    only in his eyes   Throat cancer (HCC)    Thrombocytopenia (HCC)    H/O thrombocytopenia    Past Surgical History:  Procedure Laterality Date   PILONIDAL CYST EXCISION  remote   THROAT SURGERY  2015    Social History   Socioeconomic History   Marital status: Divorced    Spouse name: Not on file   Number of children: 1   Years of education: Not on file   Highest education level: GED or equivalent  Occupational History   Occupation: retired  Tobacco Use   Smoking status: Former    Current packs/day: 0.00    Average packs/day: 1 pack/day for 60.0 years (60.0 ttl pk-yrs)    Types: Cigarettes    Quit date: 01/02/2023    Years since quitting: 1.1   Smokeless tobacco: Never  Vaping Use   Vaping status: Never Used  Substance and Sexual Activity   Alcohol use: Not Currently    Alcohol/week: 4.0 standard drinks of alcohol    Types: 4 Cans of beer per week    Comment: 4 cans per week   Drug use: Never   Sexual activity: Not Currently  Other Topics Concern   Not on file  Social History Narrative   Not on  file   Social Drivers of Health   Financial Resource Strain: Low Risk  (12/23/2023)   Overall Financial Resource Strain (CARDIA)    Difficulty of Paying Living Expenses: Not hard at all  Food Insecurity: No Food Insecurity (12/23/2023)   Hunger Vital Sign    Worried About Running Out of Food in the Last Year: Never true    Ran Out of Food in the Last Year: Never true  Transportation Needs: No Transportation Needs (12/23/2023)   PRAPARE - Administrator, Civil Service (Medical): No    Lack of Transportation (Non-Medical): No  Physical Activity: Insufficiently Active (12/23/2023)   Exercise Vital Sign    Days of Exercise per Week: 3 days    Minutes of Exercise per Session: 30 min  Stress: No Stress Concern Present (12/23/2023)   [employer] of Occupational Health - Occupational Stress Questionnaire    Feeling of Stress : Not at all  Social Connections:  Socially Isolated (12/23/2023)   Social Connection and Isolation Panel [NHANES]    Frequency of Communication with Friends and Family: More than three times a week    Frequency of Social Gatherings with Friends and Family: Three times a week    Attends Religious Services: Never    Active Member of Clubs or Organizations: No    Attends Banker Meetings: Never    Marital Status: Divorced    Family History  Problem Relation Age of Onset   Cancer Mother        breast    Diabetes Mother    Hypertension Mother    Cancer Father    Cancer Brother        kidney    Outpatient Encounter Medications as of 02/18/2024  Medication Sig   glucose blood (FREESTYLE LITE) test strip Use as instructed to monitor glucose 4 times daily.  Use as back up to CGM   Accu-Chek Softclix Lancets lancets 1 each by Other route 3 (three) times daily.   albuterol  (VENTOLIN  HFA) 108 (90 Base) MCG/ACT inhaler INHALE 2 PUFFS EVERY 6 HOURS AS NEEDED FOR WHEEZING OR SHORTNESS OF BREATH   azelastine  (ASTELIN ) 0.1 % nasal spray 2 SPRAYS IN  EACH NOSTRIL 2 TIMES A DAY   Blood Glucose Monitoring Suppl (ACCU-CHEK GUIDE) w/Device KIT CHECK BLOOD SUGAR AS DIRECTED   Blood Glucose Monitoring Suppl DEVI 1 each by Does not apply route in the morning, at noon, and at bedtime. May substitute to any manufacturer covered by patient's insurance.   clopidogrel  (PLAVIX ) 75 MG tablet Take 1 tablet (75 mg total) by mouth daily.   Continuous Glucose Receiver (FREESTYLE LIBRE 3 READER) DEVI 1 each by Does not apply route 4 (four) times daily.   Continuous Glucose Sensor (FREESTYLE LIBRE 3 PLUS SENSOR) MISC CHECK BLOOD SUGAR AS DIRECTED- CHANGE SENSOR EVERY 15 DAYS   finasteride  (PROSCAR ) 5 MG tablet Take 1 tablet (5 mg total) by mouth daily.   fluticasone  (FLONASE ) 50 MCG/ACT nasal spray 1 SPRAY IN EACH NOSTRIL DAILY AS NEEDED FOR ALLERGIES   gabapentin  (NEURONTIN ) 100 MG capsule Take 1 capsule (100 mg total) by mouth at bedtime.   pravastatin  (PRAVACHOL ) 40 MG tablet Take 1 tablet (40 mg total) by mouth daily. (Patient taking differently: Take 40 mg by mouth 3 (three) times daily.)   tamsulosin  (FLOMAX ) 0.4 MG CAPS capsule Take 1 capsule (0.4 mg total) by mouth 2 (two) times daily.   ULTIGUARD SAFEPACK PEN NEEDLE 32G X 4 MM MISC Use with insulin up to 4 times/day as needed.   [DISCONTINUED] ZOXWRU BLOOD GLUCOSE TEST test strip Use to check blood sugar as directed with insulin 3 times a day & for symptoms of high or low blood sugar.   [DISCONTINUED] LANTUS SOLOSTAR 100 UNIT/ML Solostar Pen Inject 5 Units into the skin at bedtime.   No facility-administered encounter medications on file as of 02/18/2024.    ALLERGIES: Allergies  Allergen Reactions   Sulfa Antibiotics    Aspirin Rash   Ibuprofen Rash   Penicillins Rash    VACCINATION STATUS: Immunization History  Administered Date(s) Administered   Fluad Quad(high Dose 65+) 08/21/2022   Fluad Trivalent(High Dose 65+) 09/08/2023   Moderna Covid-19 Fall Seasonal Vaccine 63yrs & older 09/08/2023    Moderna Sars-Covid-2 Vaccination 08/22/2020, 12/17/2021   PFIZER(Purple Top)SARS-COV-2 Vaccination 12/22/2019, 01/20/2020   Pfizer(Comirnaty)Fall Seasonal Vaccine 12 years and older 08/05/2022   Pneumococcal Conjugate-13 03/15/2018   Unspecified SARS-COV-2 Vaccination 08/05/2022   Zoster  Recombinant(Shingrix ) 12/29/2022    Diabetes He presents for his initial diabetic visit. He has type 2 diabetes mellitus. Onset time: Diagnosed at age 49. His disease course has been improving. Hypoglycemia symptoms include nervousness/anxiousness, sweats and tremors. There are no diabetic associated symptoms. Hypoglycemia complications include nocturnal hypoglycemia. Diabetic complications include nephropathy. Risk factors for coronary artery disease include diabetes mellitus, dyslipidemia, male sex and hypertension. Current diabetic treatment includes diet and insulin injections. His weight is fluctuating minimally. He is following a generally healthy diet. When asked about meal planning, he reported none. He has not had a previous visit with a dietitian. He participates in exercise intermittently. His home blood glucose trend is decreasing steadily. His breakfast blood glucose range is generally 70-90 mg/dl. (He presents today for his consultation, accompanied by his wife, with his CGM showing tight glycemic profile with frequent hypoglycemia.  His most recent A1c on 3/17 was 6.2%, improving from initial diagnosis A1c of 9.1%.  He has drastically changed his diet since first diagnosis, has incorporated 3 meals per day, and is staying away from Ensure drinks.  He does drink coffee with splenda and mostly water other times.  He does engage in routine physical activity 3-4 days a week.  He is UTD on eye exam, has never seen podiatry in the past.  Analysis of his CGM shows TIR 77%, TBR 23%, TAR 0% with a GMI of 5.3%.  His wife says she has only been giving him 5 units of Lantus and only if his glucose has been running  higher.) An ACE inhibitor/angiotensin II receptor blocker is not being taken. He does not see a podiatrist.Eye exam is current.     Review of systems  Constitutional: + Minimally fluctuating body weight, current Body mass index is 27.66 kg/m., no fatigue, no subjective hyperthermia, no subjective hypothermia Eyes: no blurry vision, no xerophthalmia ENT: no sore throat, no nodules palpated in throat, no dysphagia/odynophagia, no hoarseness Cardiovascular: no chest pain, no shortness of breath, no palpitations, no leg swelling Respiratory: no cough, no shortness of breath Gastrointestinal: no nausea/vomiting/diarrhea Musculoskeletal: no muscle/joint aches Skin: no rashes, no hyperemia Neurological: no tremors, no numbness, no tingling, no dizziness Psychiatric: no depression, no anxiety  Objective:     BP 124/66   Pulse 64   Ht 5\' 5"  (1.651 m)   Wt 166 lb 3.2 oz (75.4 kg)   BMI 27.66 kg/m   Wt Readings from Last 3 Encounters:  02/18/24 166 lb 3.2 oz (75.4 kg)  01/11/24 161 lb (73 kg)  12/23/23 167 lb (75.8 kg)     BP Readings from Last 3 Encounters:  02/18/24 124/66  01/11/24 (!) 90/57  11/05/23 (!) 149/68     Physical Exam- Limited  Constitutional:  Body mass index is 27.66 kg/m. , not in acute distress, normal state of mind Eyes:  EOMI, no exophthalmos Neck: Supple Cardiovascular: RRR, no murmurs, rubs, or gallops, no edema Respiratory: Adequate breathing efforts, no crackles, rales, rhonchi, or wheezing Musculoskeletal: no gross deformities, strength intact in all four extremities, no gross restriction of joint movements Skin:  no rashes, no hyperemia Neurological: no tremor with outstretched hands   Diabetic Foot Exam - Simple   No data filed      CMP ( most recent) CMP     Component Value Date/Time   NA 140 01/11/2024 0806   K 4.5 01/11/2024 0806   CL 102 01/11/2024 0806   CO2 23 01/11/2024 0806   GLUCOSE 120 (H) 01/11/2024 1610  GLUCOSE 106 (H)  08/21/2010 1106   BUN 13 01/11/2024 0806   CREATININE 0.99 01/11/2024 0806   CALCIUM 9.8 01/11/2024 0806   PROT 7.2 01/11/2024 0806   ALBUMIN 4.6 01/11/2024 0806   AST 14 01/11/2024 0806   ALT 9 01/11/2024 0806   ALKPHOS 53 01/11/2024 0806   BILITOT 0.4 01/11/2024 0806   EGFR 81 01/11/2024 0806   GFRNONAA 79 02/15/2020 1023     Diabetic Labs (most recent): Lab Results  Component Value Date   HGBA1C 6.2 (H) 01/11/2024   HGBA1C 9.1 (H) 10/09/2023   HGBA1C 7.0 (H) 07/08/2023     Lipid Panel ( most recent) Lipid Panel     Component Value Date/Time   CHOL 169 01/11/2024 0806   TRIG 75 01/11/2024 0806   HDL 45 01/11/2024 0806   CHOLHDL 3.8 01/11/2024 0806   LDLCALC 110 (H) 01/11/2024 0806   LABVLDL 14 01/11/2024 0806      Lab Results  Component Value Date   TSH 2.360 08/21/2022   TSH 2.970 01/17/2021   TSH 3.210 02/15/2020   TSH 2.910 08/24/2019           Assessment & Plan:   1) Type 2 diabetes mellitus without complication, without long-term current use of insulin (HCC) (Primary)  He presents today for his consultation, accompanied by his wife, with his CGM showing tight glycemic profile with frequent hypoglycemia.  His most recent A1c on 3/17 was 6.2%, improving from initial diagnosis A1c of 9.1%.  He has drastically changed his diet since first diagnosis, has incorporated 3 meals per day, and is staying away from Ensure drinks.  He does drink coffee with splenda and mostly water other times.  He does engage in routine physical activity 3-4 days a week.  He is UTD on eye exam, has never seen podiatry in the past.  Analysis of his CGM shows TIR 77%, TBR 23%, TAR 0% with a GMI of 5.3%.  His wife says she has only been giving him 5 units of Lantus and only if his glucose has been running higher.  - Colin Mitchell has currently uncontrolled symptomatic type 2 DM since 73 years of age, with most recent A1c of 6.2 %.   -Recent labs reviewed.  - I had a long  discussion with him about the progressive nature of diabetes and the pathology behind its complications. -his diabetes is complicated by mild CKD and he remains at a high risk for more acute and chronic complications which include CAD, CVA, CKD, retinopathy, and neuropathy. These are all discussed in detail with him.  The following Lifestyle Medicine recommendations according to American College of Lifestyle Medicine [facility]) were discussed and offered to patient and he agrees to start the journey:  A. Whole Foods, Plant-based plate comprising of fruits and vegetables, plant-based proteins, whole-grain carbohydrates was discussed in detail with the patient.   A list for source of those nutrients were also provided to the patient.  Patient will use only water or unsweetened tea for hydration. B.  The need to stay away from risky substances including alcohol, smoking; obtaining 7 to 9 hours of restorative sleep, at least 150 minutes of moderate intensity exercise weekly, the importance of healthy social connections,  and stress reduction techniques were discussed. C.  A full color page of  Calorie density of various food groups per pound showing examples of each food groups was provided to the patient.  - I have counseled him on diet and weight  management by adopting a carbohydrate restricted/protein rich diet. Patient is encouraged to switch to unprocessed or minimally processed complex starch and increased protein intake (animal or plant source), fruits, and vegetables. -  he is advised to stick to a routine mealtimes to eat 3 meals a day and avoid unnecessary snacks (to snack only to correct hypoglycemia).   - he acknowledges that there is a room for improvement in his food and drink choices. - Suggestion is made for him to avoid simple carbohydrates from his diet including Cakes, Sweet Desserts, Ice Cream, Soda (diet and regular), Sweet Tea, Candies, Chips, Cookies, Store Bought Juices, Alcohol in Excess  of 1-2 drinks a day, Artificial Sweeteners, Coffee Creamer, and "Sugar-free" Products. This will help patient to have more stable blood glucose profile and potentially avoid unintended weight gain.  - I have approached him with the following individualized plan to manage his diabetes and patient agrees:   -I advised him and his wife NOT to take the Lantus or Humalog anymore.  I imaging he will do just fine without them as he adjusts his diet.  -he is encouraged to start/continue monitoring glucose 2 times daily (using his CGM), before breakfast and before bed, to log their readings on the clinic sheets provided, and bring them to review at follow up appointment in 4 months.  - he is not an ideal candidate for incretin therapy given body habitus.  - Specific targets for  A1c; LDL, HDL, and Triglycerides were discussed with the patient.  2) Blood Pressure /Hypertension:  his blood pressure is controlled to target.   he is advised to continue his current medications as prescribed by his PCP.  3) Lipids/Hyperlipidemia:    Review of his recent lipid panel from 01/11/24 showed uncontrolled LDL at 110 .  he is advised to continue Pravastatin  40 mg daily at bedtime.  Side effects and precautions discussed with him.  4)  Weight/Diet:  his Body mass index is 27.66 kg/m.  -   he is NOT a candidate for weight loss.  Exercise, and detailed carbohydrates information provided  -  detailed on discharge instructions.  5) Chronic Care/Health Maintenance: -he is not on ACEI/ARB and is on Statin medications and is encouraged to initiate and continue to follow up with Ophthalmology, Dentist, Podiatrist at least yearly or according to recommendations, and advised to stay away from smoking. I have recommended yearly flu vaccine and pneumonia vaccine at least every 5 years; moderate intensity exercise for up to 150 minutes weekly; and sleep for at least 7 hours a day.  - he is advised to maintain close follow up  with Dettinger, Lucio Sabin, MD for primary care needs, as well as his other providers for optimal and coordinated care.   - Time spent in this patient care: 60 min, which was spent in counseling him about his diabetes and the rest reviewing his blood glucose logs, discussing his hypoglycemia and hyperglycemia episodes, reviewing his current and previous labs/studies (including abstraction from other facilities) and medications doses and developing a long term treatment plan based on the latest standards of care/guidelines; and documenting his care.    Please refer to Patient Instructions for Blood Glucose Monitoring and Insulin/Medications Dosing Guide" in media tab for additional information. Please also refer to "Patient Self Inventory" in the Media tab for reviewed elements of pertinent patient history.  Colin Mitchell participated in the discussions, expressed understanding, and voiced agreement with the above plans.  All questions were answered to  his satisfaction. he is encouraged to contact clinic should he have any questions or concerns prior to his return visit.     Follow up plan: - Return in about 4 months (around 06/19/2024) for Diabetes F/U with A1c in office, No previsit labs, Bring meter and logs.    Hulon Magic, [facility] [address] [place], [place] [address] Phone: [phone] Fax: [phone]  02/18/2024, 12:08 PM

## 2024-02-22 ENCOUNTER — Inpatient Hospital Stay: Attending: Oncology | Admitting: Oncology

## 2024-02-22 ENCOUNTER — Encounter: Payer: Self-pay | Admitting: Oncology

## 2024-02-22 ENCOUNTER — Other Ambulatory Visit

## 2024-02-22 ENCOUNTER — Inpatient Hospital Stay

## 2024-02-22 ENCOUNTER — Encounter: Admitting: Oncology

## 2024-02-22 ENCOUNTER — Encounter: Payer: Self-pay | Admitting: Nutrition

## 2024-02-22 VITALS — BP 94/74 | HR 75 | Temp 97.6°F | Resp 16 | Ht 65.0 in | Wt 166.3 lb

## 2024-02-22 DIAGNOSIS — D72819 Decreased white blood cell count, unspecified: Secondary | ICD-10-CM | POA: Insufficient documentation

## 2024-02-22 DIAGNOSIS — E119 Type 2 diabetes mellitus without complications: Secondary | ICD-10-CM | POA: Insufficient documentation

## 2024-02-22 DIAGNOSIS — Z85819 Personal history of malignant neoplasm of unspecified site of lip, oral cavity, and pharynx: Secondary | ICD-10-CM | POA: Diagnosis not present

## 2024-02-22 DIAGNOSIS — Z923 Personal history of irradiation: Secondary | ICD-10-CM | POA: Insufficient documentation

## 2024-02-22 DIAGNOSIS — F1011 Alcohol abuse, in remission: Secondary | ICD-10-CM | POA: Diagnosis not present

## 2024-02-22 DIAGNOSIS — E611 Iron deficiency: Secondary | ICD-10-CM | POA: Diagnosis not present

## 2024-02-22 LAB — CMP (CANCER CENTER ONLY)
ALT: 10 U/L (ref 0–44)
AST: 20 U/L (ref 15–41)
Albumin: 5 g/dL (ref 3.5–5.0)
Alkaline Phosphatase: 54 U/L (ref 38–126)
Anion gap: 10 (ref 5–15)
BUN: 11 mg/dL (ref 8–23)
CO2: 27 mmol/L (ref 22–32)
Calcium: 10.2 mg/dL (ref 8.9–10.3)
Chloride: 103 mmol/L (ref 98–111)
Creatinine: 1.06 mg/dL (ref 0.61–1.24)
GFR, Estimated: >60 mL/min (ref >60)
Glucose, Bld: 105 mg/dL — ABNORMAL HIGH (ref 70–99)
Potassium: 4.3 mmol/L (ref 3.5–5.1)
Sodium: 140 mmol/L (ref 135–145)
Total Bilirubin: 0.4 mg/dL (ref 0.0–1.2)
Total Protein: 8.1 g/dL (ref 6.5–8.1)

## 2024-02-22 LAB — RETICULOCYTES
Immature Retic Fract: 8 % (ref 2.3–15.9)
RBC.: 5.05 MIL/uL (ref 4.22–5.81)
Retic Count, Absolute: 32.8 10*3/uL (ref 19.0–186.0)
Retic Ct Pct: 0.7 % (ref 0.4–3.1)

## 2024-02-22 LAB — CBC WITH DIFFERENTIAL (CANCER CENTER ONLY)
Abs Immature Granulocytes: 0 10*3/uL (ref 0.00–0.07)
Basophils Absolute: 0 10*3/uL (ref 0.0–0.1)
Basophils Relative: 1 %
Eosinophils Absolute: 0.1 10*3/uL (ref 0.0–0.5)
Eosinophils Relative: 4 %
HCT: 40.9 % (ref 39.0–52.0)
Hemoglobin: 13.4 g/dL (ref 13.0–17.0)
Immature Granulocytes: 0 %
Lymphocytes Relative: 27 %
Lymphs Abs: 0.7 10*3/uL (ref 0.7–4.0)
MCH: 26.8 pg (ref 26.0–34.0)
MCHC: 32.8 g/dL (ref 30.0–36.0)
MCV: 81.8 fL (ref 80.0–100.0)
Monocytes Absolute: 0.2 10*3/uL (ref 0.1–1.0)
Monocytes Relative: 9 %
Neutro Abs: 1.5 10*3/uL — ABNORMAL LOW (ref 1.7–7.7)
Neutrophils Relative %: 59 %
Platelet Count: 171 10*3/uL (ref 150–400)
RBC: 5 MIL/uL (ref 4.22–5.81)
RDW: 15.2 % (ref 11.5–15.5)
WBC Count: 2.5 10*3/uL — ABNORMAL LOW (ref 4.0–10.5)
nRBC: 0 % (ref 0.0–0.2)

## 2024-02-22 LAB — HEPATITIS PANEL, ACUTE
HCV Ab: NONREACTIVE
Hep A IgM: NONREACTIVE
Hep B C IgM: NONREACTIVE
Hepatitis B Surface Ag: NONREACTIVE

## 2024-02-22 LAB — VITAMIN B12: Vitamin B-12: 684 pg/mL (ref 180–914)

## 2024-02-22 LAB — HIV ANTIBODY (ROUTINE TESTING W REFLEX): HIV Screen 4th Generation wRfx: NONREACTIVE

## 2024-02-22 LAB — IRON AND TIBC
Iron: 57 ug/dL (ref 45–182)
Saturation Ratios: 13 % — ABNORMAL LOW (ref 17.9–39.5)
TIBC: 452 ug/dL — ABNORMAL HIGH (ref 250–450)
UIBC: 395 ug/dL

## 2024-02-22 LAB — FOLATE: Folate: 19.8 ng/mL (ref >5.9)

## 2024-02-22 LAB — LACTATE DEHYDROGENASE: LDH: 126 U/L (ref 98–192)

## 2024-02-22 LAB — FERRITIN: Ferritin: 19 ng/mL — ABNORMAL LOW (ref 24–336)

## 2024-02-22 LAB — TSH: TSH: 2.73 u[IU]/mL (ref 0.350–4.500)
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
## 2024-02-22 NOTE — Assessment & Plan Note (Addendum)
 Chronic leukopenia with white blood cell counts ranging from 2,300 to 3,700 over the past 14 years.   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 No evidence of recurrent infections, systemic symptoms, fevers, chills, or night sweats. Differential includes benign ethnic neutropenia and other causes of leukopenia, but bone marrow pathology is unlikely. Eating well and maintaining weight.  Labs today showed stable white count of 2500 with ANC of 1000.8, normal differential.  No immature cells noted in the periphery.  Hemoglobin 13.4, platelet count 121,000.  CMP, LDH, vitamin B12, folic acid, TSH, reticulocyte count are all within normal limits.  -Iron studies showed evidence of iron deficiency with iron saturation of 13%, ferritin of 19, slightly increased iron binding capacity of 452.  Will discuss iron replacement on phone call visit.  We also ordered thyroid  function tests, autoimmune tests, and viral testing (hepatitis, HIV) to evaluate causes of leukopenia.  - Phone call visit in two weeks to discuss lab results.  - Plan to see him again in six months unless earlier follow-up is needed based on lab results.

## 2024-02-22 NOTE — Progress Notes (Signed)
 Patient given a bag of food.

## 2024-02-22 NOTE — Assessment & Plan Note (Signed)
 History of squamous cell carcinoma of the uvula diagnosed in 2006, treated initially with radiation, with recurrence in 2011, s/p resection on 08/22/2010.  Remains in remission clinically.

## 2024-02-23 ENCOUNTER — Telehealth: Payer: Self-pay | Admitting: Oncology

## 2024-02-23 LAB — ANA W/REFLEX IF POSITIVE: Anti Nuclear Antibody (ANA): NEGATIVE

## 2024-02-23 LAB — RHEUMATOID FACTOR: Rheumatoid fact SerPl-aCnc: <10 [IU]/mL (ref <14.0)

## 2024-02-23 NOTE — Telephone Encounter (Signed)
 Patient has been scheduled for follow-up visit per 02/22/24 LOS.  Pt aware of scheduled appt details.

## 2024-02-24 LAB — HGB FRACTIONATION CASCADE
Hgb A2: 2.6 % (ref 1.8–3.2)
Hgb A: 58.2 % — ABNORMAL LOW (ref 96.4–98.8)
Hgb F: 0 % (ref 0.0–2.0)
Hgb S: 39.2 % — ABNORMAL HIGH

## 2024-02-24 LAB — HGB SOLUBILITY: Hgb Solubility: POSITIVE — AB

## 2024-02-24 LAB — COPPER, SERUM: Copper: 126 ug/dL (ref 69–132)

## 2024-02-26 ENCOUNTER — Ambulatory Visit: Payer: Self-pay

## 2024-02-26 DIAGNOSIS — J3089 Other allergic rhinitis: Secondary | ICD-10-CM

## 2024-02-26 MED ORDER — AZELASTINE HCL 0.1 % NA SOLN: 1.0000 | Freq: Two times a day (BID) | NASAL | 5 refills | Status: AC

## 2024-02-26 MED ORDER — FLUTICASONE PROPIONATE 50 MCG/ACT NA SUSP
1.0000 | Freq: Every day | NASAL | 6 refills | Status: DC
Start: 2024-02-26 — End: 2024-03-08

## 2024-02-26 NOTE — Telephone Encounter (Signed)
  Chief Complaint: medication refill Symptoms: nasal congestion/stuffiness, red itchy eyes Frequency: x 1-2 months Pertinent Negatives: Patient denies fever, sinus pain, wheezing, difficulty breathing Disposition: [] ED /[] Urgent Care (no appt availability in office) / [] Appointment(In office/virtual)/ []  [facility]/ [] Home Care/ [] Refused Recommended Disposition /[] [facility]/ [x]  Follow-up with PCP Additional Notes: Patient's caregiver, Hays Lipschutz, on the phone. She states the patient's seasonal allergies have been acting up and he has run out of his flonase  and astelin  sprays. They tried OTC nasal spray and state it has not helped. She states the patient does well with those 2 prescriptions. Advised she call back for any new or worsening symptoms and did advise it can take up to 3 business days for medication refills to be addressed.  Copied from CRM [id number]. Topic: Clinical - Pink Word Triage >> Feb 26, 2024  2:31 PM Minus Amel G wrote: Reason for CRM: patient is suffering from allergies, not in any pain but can't breath out of nose. fluticasone  (FLONASE ) 50 MCG/ACT nasal spray, azelastine  (ASTELIN ) 0.1 % nasal spray [phone] Reason for Disposition  [1] Nasal allergies AND [2] only certain times of year (hay fever)  Answer Assessment - Initial Assessment Questions 1. SYMPTOM: "What's the main symptom you're concerned about?" (e.g., runny nose, stuffiness, sneezing, itching)     Nasal congestion/stuffiness 2. SEVERITY: "How bad is it?" "What does it keep you from doing?" (e.g., sleeping, working)       3. EYES: "Are the eyes also red, watery, and itchy?"      Red and itchy eyes.  4. TRIGGER: "What pollen or other allergic substance do you think is causing the symptoms?"      Pollen.  5. TREATMENT: "What medicine are you using?" "What medicine worked best in the past?"     He was using Flonase  and Astelin  spray but has run out, he tried OTC and that did not do  anything for him.  6. OTHER SYMPTOMS: "Do you have any other symptoms?" (e.g., coughing, difficulty breathing, wheezing)     Sneezing.  7. PREGNANCY: "Is there any chance you are pregnant?" "When was your last menstrual period?"     N/A.  Protocols used: Nasal Allergies (Hay Fever)-A-AH

## 2024-02-26 NOTE — Telephone Encounter (Signed)
 Sent medication for both Flonase  and Astelin  nasal sprays

## 2024-02-29 ENCOUNTER — Encounter: Payer: Self-pay | Admitting: Adult Health

## 2024-02-29 ENCOUNTER — Ambulatory Visit: Admitting: Adult Health

## 2024-02-29 DIAGNOSIS — Z87891 Personal history of nicotine dependence: Secondary | ICD-10-CM | POA: Diagnosis not present

## 2024-02-29 NOTE — Progress Notes (Signed)
  Virtual Visit via Telephone Note  I connected with Colin Mitchell , 02/29/24 9:58 AM by a telemedicine application and verified that I am speaking with the correct person using two identifiers.  Location: Patient: home Provider: home   I discussed the limitations of evaluation and management by telemedicine and the availability of in person appointments. The patient expressed understanding and agreed to proceed.   Shared Decision Making Visit Lung Cancer Screening Program [phone])   Eligibility: 73 y.o. Pack Years Smoking History Calculation = 62 pack years  (# packs/per year x # years smoked) Recent History of coughing up blood  no Unexplained weight loss? no ( >Than 15 pounds within the last 6 months ) Prior History Lung / other cancer yes - hx pharyngeal cancer  (Diagnosis within the last 5 years already requiring surveillance chest CT Scans). Smoking Status Former Smoker Former Smokers: Years since quit: 1 year  Quit Date: 2024  Visit Components: Discussion included one or more decision making aids. YES Discussion included risk/benefits of screening. YES Discussion included potential follow up diagnostic testing for abnormal scans. YES Discussion included meaning and risk of over diagnosis. YES Discussion included meaning and risk of False Positives. YES Discussion included meaning of total radiation exposure. YES  Counseling Included: Importance of adherence to annual lung cancer LDCT screening. YES Impact of comorbidities on ability to participate in the program. YES Ability and willingness to under diagnostic treatment. YES  Smoking Cessation Counseling: Former Smokers:  Discussed the importance of maintaining cigarette abstinence. yes Diagnosis Code: Personal History of Nicotine Dependence. P29.518 Information about tobacco cessation classes and interventions provided to patient. Yes Patient provided with "ticket" for LDCT Scan. yes Written Order for Lung  Cancer Screening with LDCT placed in Epic. Yes (CT Chest Lung Cancer Screening Low Dose W/O CM) [id number]  Z12.2-Screening of respiratory organs Z87.891-Personal history of nicotine dependence   [name] 02/29/24

## 2024-02-29 NOTE — Patient Instructions (Signed)

## 2024-03-01 ENCOUNTER — Ambulatory Visit (HOSPITAL_COMMUNITY)

## 2024-03-01 DIAGNOSIS — R1310 Dysphagia, unspecified: Secondary | ICD-10-CM | POA: Diagnosis not present

## 2024-03-01 DIAGNOSIS — Z87891 Personal history of nicotine dependence: Secondary | ICD-10-CM | POA: Diagnosis not present

## 2024-03-01 DIAGNOSIS — Z85818 Personal history of malignant neoplasm of other sites of lip, oral cavity, and pharynx: Secondary | ICD-10-CM | POA: Diagnosis not present

## 2024-03-02 ENCOUNTER — Other Ambulatory Visit: Payer: Self-pay | Admitting: Otolaryngology

## 2024-03-02 DIAGNOSIS — Z85819 Personal history of malignant neoplasm of unspecified site of lip, oral cavity, and pharynx: Secondary | ICD-10-CM

## 2024-03-08 ENCOUNTER — Inpatient Hospital Stay: Attending: Oncology | Admitting: Oncology

## 2024-03-08 ENCOUNTER — Telehealth: Payer: Self-pay | Admitting: *Deleted

## 2024-03-08 ENCOUNTER — Encounter: Payer: Self-pay | Admitting: Oncology

## 2024-03-08 DIAGNOSIS — E611 Iron deficiency: Secondary | ICD-10-CM | POA: Diagnosis not present

## 2024-03-08 DIAGNOSIS — D573 Sickle-cell trait: Secondary | ICD-10-CM

## 2024-03-08 DIAGNOSIS — J3089 Other allergic rhinitis: Secondary | ICD-10-CM

## 2024-03-08 DIAGNOSIS — D72819 Decreased white blood cell count, unspecified: Secondary | ICD-10-CM

## 2024-03-08 MED ORDER — FERROUS SULFATE 325 (65 FE) MG PO TBEC
325.0000 mg | DELAYED_RELEASE_TABLET | Freq: Every day | ORAL | 3 refills | Status: DC
Start: 2024-03-08 — End: 2024-08-23

## 2024-03-08 MED ORDER — FLUTICASONE PROPIONATE 50 MCG/ACT NA SUSP
1.0000 | Freq: Every day | NASAL | 6 refills | Status: AC
Start: 2024-03-08 — End: ?

## 2024-03-08 MED ORDER — VITAMIN C 100 MG PO TABS: 100.0000 mg | ORAL_TABLET | Freq: Every day | ORAL | 3 refills | Status: AC
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
## 2024-03-08 NOTE — Telephone Encounter (Signed)
 Fax from [facility] RE: Flonase  Needs directions/SIG code Has not been filled there before, pt recently transferred to their pharmacy Script recent, was originally set to Print

## 2024-03-08 NOTE — Assessment & Plan Note (Addendum)
 Chronic leukopenia with white blood cell counts ranging from 2,300 to 3,700 over the past 14 years.   On his initial consultation with us  on 02/22/2024, labs stable white count of 2500 with ANC of 1500, normal differential.  No immature cells noted in the periphery.  Hemoglobin 13.4, platelet count 121,000.  CMP, LDH, vitamin B12, folic acid , TSH, reticulocyte count were all within normal limits. Iron studies showed evidence of iron deficiency with iron saturation of 13%, ferritin of 19, slightly increased iron binding capacity of 452.  Hepatitis panel, HIV, rheumatoid factor, ANA were negative.  Hemoglobin electrophoresis showed evidence of sickle cell trait with 39.2% Hgb S and 58.2% Hgb A, Hgb A2 of 2.6%.   Patient was started on oral iron supplements with ferrous sulfate 325 mg daily.  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 No evidence of recurrent infections, systemic symptoms, fevers, chills, or night sweats. Differential includes benign ethnic neutropenia and other causes of leukopenia, but bone marrow pathology is unlikely.   - Plan to see him again in six months with repeat labs.

## 2024-03-08 NOTE — Assessment & Plan Note (Signed)
 Confirmed sickle cell trait with 40% hemoglobin as sickle cell type. No evidence of sickle cell disease. Previous reports indicated sickle cell trait noted in the eyes. No current treatment required.

## 2024-03-18 ENCOUNTER — Other Ambulatory Visit: Payer: Self-pay | Admitting: Family Medicine

## 2024-03-18 DIAGNOSIS — R062 Wheezing: Secondary | ICD-10-CM

## 2024-03-23 ENCOUNTER — Ambulatory Visit (HOSPITAL_COMMUNITY)
Admission: RE | Admit: 2024-03-23 | Discharge: 2024-03-23 | Disposition: A | Discharge status: Home / Self Care | Source: Ambulatory Visit | Attending: Acute Care | Admitting: Acute Care

## 2024-03-23 DIAGNOSIS — Z122 Encounter for screening for malignant neoplasm of respiratory organs: Secondary | ICD-10-CM | POA: Diagnosis not present

## 2024-03-23 DIAGNOSIS — F17210 Nicotine dependence, cigarettes, uncomplicated: Secondary | ICD-10-CM | POA: Diagnosis not present

## 2024-03-23 DIAGNOSIS — Z87891 Personal history of nicotine dependence: Secondary | ICD-10-CM | POA: Insufficient documentation

## 2024-04-08 ENCOUNTER — Other Ambulatory Visit: Payer: Self-pay

## 2024-04-08 DIAGNOSIS — Z87891 Personal history of nicotine dependence: Secondary | ICD-10-CM

## 2024-04-08 DIAGNOSIS — Z122 Encounter for screening for malignant neoplasm of respiratory organs: Secondary | ICD-10-CM

## 2024-04-12 ENCOUNTER — Telehealth: Payer: Self-pay | Admitting: Family Medicine

## 2024-04-12 NOTE — Telephone Encounter (Signed)
-----   Message from Lucio Sabin Dettinger sent at 04/11/2024  7:36 AM EDT ----- No signs of cancer in lungs.  Slight increased blood pressure in the arteries of the lungs, likely related to the COPD/emphysema that was also seen. ----- Message ----- From: Deann Exon, RN Sent: 04/08/2024   7:47 AM EDT To: Lucio Sabin Dettinger, MD

## 2024-04-12 NOTE — Telephone Encounter (Signed)
 Called and spoke to long term friend on [place] and they are aware.

## 2024-04-13 ENCOUNTER — Encounter: Payer: Self-pay | Admitting: Family Medicine

## 2024-04-13 ENCOUNTER — Ambulatory Visit: Admitting: Family Medicine

## 2024-04-13 VITALS — BP 137/75 | HR 69 | Ht 65.0 in | Wt 167.0 lb

## 2024-04-13 DIAGNOSIS — Z23 Encounter for immunization: Secondary | ICD-10-CM

## 2024-04-13 DIAGNOSIS — E1169 Type 2 diabetes mellitus with other specified complication: Secondary | ICD-10-CM

## 2024-04-13 DIAGNOSIS — N400 Enlarged prostate without lower urinary tract symptoms: Secondary | ICD-10-CM

## 2024-04-13 DIAGNOSIS — E782 Mixed hyperlipidemia: Secondary | ICD-10-CM

## 2024-04-13 LAB — BAYER DCA HB A1C WAIVED: HB A1C (BAYER DCA - WAIVED): 6 % — ABNORMAL HIGH (ref 4.8–5.6)

## 2024-04-13 MED ORDER — FREESTYLE LITE TEST VI STRP: ORAL_STRIP | 12 refills | Status: AC

## 2024-04-13 MED ORDER — ACCU-CHEK SOFTCLIX LANCETS MISC: 1.0000 | Freq: Three times a day (TID) | 3 refills | Status: AC

## 2024-04-13 MED ORDER — HUMALOG JUNIOR KWIKPEN 100 UNIT/ML ~~LOC~~ SOPN: 5.0000 [IU] | PEN_INJECTOR | Freq: Three times a day (TID) | SUBCUTANEOUS | 3 refills | Status: DC | PRN

## 2024-04-13 NOTE — Progress Notes (Signed)
 BP 137/75   Pulse 69   Ht 5' 5 (1.651 m)   Wt 167 lb (75.8 kg)   SpO2 95%   BMI 27.79 kg/m    Subjective:   Patient ID: Colin Mitchell, male    DOB: April 19, 1951, 73 y.o.   MRN: [id number]  HPI: Colin Mitchell is a 73 y.o. male presenting on 04/13/2024 for Medical Management of Chronic Issues, Diabetes, and Hyperlipidemia   HPI Type 2 diabetes mellitus Patient comes in today for recheck of his diabetes. Patient has been currently taking no medicine currently except occasional insulin as needed.. Patient is not currently on an ACE inhibitor/ARB. Patient has not seen an ophthalmologist this year. Patient denies any new issues with their feet. The symptom started onset as an adult hyperlipidemia ARE RELATED TO DM   Hyperlipidemia Patient is coming in for recheck of his hyperlipidemia. The patient is currently taking pravastatin . They deny any issues with myalgias or history of liver damage from it. They deny any focal numbness or weakness or chest pain.   BPH Patient is coming in for recheck on BPH Symptoms: None currently Medication: Finasteride  and Flomax  Last PSA: gets at urology   Relevant past medical, surgical, family and social history reviewed and updated as indicated. Interim medical history since our last visit reviewed. Allergies and medications reviewed and updated.  Review of Systems  Constitutional:  Negative for chills and fever.  Eyes:  Negative for visual disturbance.  Respiratory:  Negative for shortness of breath and wheezing.   Cardiovascular:  Negative for chest pain and leg swelling.  Musculoskeletal:  Negative for back pain and gait problem.  Skin:  Negative for rash.  All other systems reviewed and are negative.   Per HPI unless specifically indicated above   Allergies as of 04/13/2024       Reactions   Sulfa Antibiotics    Aspirin Rash   Ibuprofen Rash   Penicillins Rash        Medication List        Accurate as of April 13, 2024  10:45 AM. If you have any questions, ask your nurse or doctor.          Accu-Chek Guide w/Device Kit CHECK BLOOD SUGAR AS DIRECTED   Blood Glucose Monitoring Suppl Devi 1 each by Does not apply route in the morning, at noon, and at bedtime. May substitute to any manufacturer covered by patient's insurance.   Accu-Chek Softclix Lancets lancets 1 each by Other route 3 (three) times daily. E11.9- DM   albuterol  108 (90 Base) MCG/ACT inhaler Commonly known as: VENTOLIN  HFA INHALE 2 PUFFS EVERY 6 HOURS AS NEEDED FOR WHEEZING OR SHORTNESS OF BREATH   azelastine  0.1 % nasal spray Commonly known as: ASTELIN  Place 1 spray into both nostrils 2 (two) times daily. Use in each nostril as directed   clopidogrel  75 MG tablet Commonly known as: PLAVIX  Take 1 tablet (75 mg total) by mouth daily.   ferrous sulfate  325 (65 FE) MG EC tablet Take 1 tablet (325 mg total) by mouth daily with breakfast.   finasteride  5 MG tablet Commonly known as: Proscar  Take 1 tablet (5 mg total) by mouth daily.   fluticasone  50 MCG/ACT nasal spray Commonly known as: FLONASE  Place 1 spray into both nostrils daily.   FreeStyle Libre 3 Plus Sensor Misc CHECK BLOOD SUGAR AS DIRECTED- CHANGE SENSOR EVERY 15 DAYS   FreeStyle Libre 3 Reader Devi 1 each by Does not apply route 4 (  four) times daily.   FREESTYLE LITE test strip Generic drug: glucose blood Use as instructed to monitor glucose 4 times daily.  Use as back up to CGM   gabapentin  100 MG capsule Commonly known as: NEURONTIN  Take 1 capsule (100 mg total) by mouth at bedtime.   HumaLOG Junior KwikPen 100 UNIT/ML KwikPen Junior Generic drug: Insulin lispro Inject 5 Units into the skin 3 (three) times daily as needed (For blood sugar greater than 180 give 5 units). Started by: Lucio Sabin Harrold Fitchett   pravastatin  40 MG tablet Commonly known as: PRAVACHOL  Take 1 tablet (40 mg total) by mouth daily. What changed: when to take this   tamsulosin  0.4 MG  Caps capsule Commonly known as: FLOMAX  Take 1 capsule (0.4 mg total) by mouth 2 (two) times daily.   UltiGuard SafePack Pen Needle 32G X 4 MM Misc Generic drug: Insulin Pen Needle Use with insulin up to 4 times/day as needed.   vitamin C  100 MG tablet Take 1 tablet (100 mg total) by mouth daily.         Objective:   BP 137/75   Pulse 69   Ht 5' 5 (1.651 m)   Wt 167 lb (75.8 kg)   SpO2 95%   BMI 27.79 kg/m   Wt Readings from Last 3 Encounters:  04/13/24 167 lb (75.8 kg)  02/22/24 166 lb 4.8 oz (75.4 kg)  02/18/24 166 lb 3.2 oz (75.4 kg)    Physical Exam Vitals and nursing note reviewed.  Constitutional:      General: He is not in acute distress.    Appearance: He is well-developed. He is not diaphoretic.   Eyes:     General: No scleral icterus.    Conjunctiva/sclera: Conjunctivae normal.   Neck:     Thyroid : No thyromegaly.   Cardiovascular:     Rate and Rhythm: Normal rate and regular rhythm.     Heart sounds: Normal heart sounds. No murmur heard. Pulmonary:     Effort: Pulmonary effort is normal. No respiratory distress.     Breath sounds: Normal breath sounds. No wheezing.   Musculoskeletal:        General: No swelling. Normal range of motion.     Cervical back: Neck supple.  Lymphadenopathy:     Cervical: No cervical adenopathy.   Skin:    General: Skin is warm and dry.     Findings: No rash.   Neurological:     Mental Status: He is alert and oriented to person, place, and time.     Coordination: Coordination normal.   Psychiatric:        Behavior: Behavior normal.       Assessment & Plan:   Problem List Items Addressed This Visit       Endocrine   Diabetes mellitus (HCC) - Primary   Relevant Medications   Accu-Chek Softclix Lancets lancets   Insulin lispro (HUMALOG JUNIOR KWIKPEN) 100 UNIT/ML   glucose blood (FREESTYLE LITE) test strip   Other Relevant Orders   Bayer DCA Hb A1c Waived   CBC with Differential/Platelet    CMP14+EGFR     Genitourinary   BPH (benign prostatic hyperplasia)   Relevant Orders   PSA, total and free     Other   Hyperlipemia   Other Visit Diagnoses       Encounter for immunization       Relevant Orders   Pneumococcal conjugate vaccine 20-valent (Completed)       A1c looks good  at 6.0.  Continue current treatment, no changes. Follow up plan: Return in about 3 months (around 07/14/2024), or if symptoms worsen or fail to improve, for Diabetes.  Counseling provided for all of the vaccine components Orders Placed This Encounter  Procedures   Pneumococcal conjugate vaccine 20-valent   Bayer DCA Hb A1c Waived   CBC with Differential/Platelet   CMP14+EGFR   PSA, total and free    Jolyne Needs, MD [facility] 04/13/2024, 10:45 AM

## 2024-04-14 ENCOUNTER — Ambulatory Visit: Payer: Self-pay | Admitting: Family Medicine

## 2024-04-14 LAB — PSA, TOTAL AND FREE
PSA, Free Pct: 22.5 %
PSA, Free: 0.09 ng/mL
Prostate Specific Ag, Serum: 0.4 ng/mL (ref 0.0–4.0)

## 2024-04-14 LAB — CMP14+EGFR
ALT: 8 IU/L (ref 0–44)
AST: 13 IU/L (ref 0–40)
Albumin: 4.4 g/dL (ref 3.8–4.8)
Alkaline Phosphatase: 47 IU/L (ref 44–121)
BUN/Creatinine Ratio: 13 (ref 10–24)
BUN: 13 mg/dL (ref 8–27)
Bilirubin Total: 0.3 mg/dL (ref 0.0–1.2)
CO2: 21 mmol/L (ref 20–29)
Calcium: 9.5 mg/dL (ref 8.6–10.2)
Chloride: 101 mmol/L (ref 96–106)
Creatinine, Ser: 1.01 mg/dL (ref 0.76–1.27)
Globulin, Total: 2.8 g/dL (ref 1.5–4.5)
Glucose: 83 mg/dL (ref 70–99)
Potassium: 4.5 mmol/L (ref 3.5–5.2)
Sodium: 138 mmol/L (ref 134–144)
Total Protein: 7.2 g/dL (ref 6.0–8.5)
eGFR: 79 mL/min/{1.73_m2} (ref >59)

## 2024-04-14 LAB — CBC WITH DIFFERENTIAL/PLATELET
Basophils Absolute: 0 10*3/uL (ref 0.0–0.2)
Basos: 0 %
EOS (ABSOLUTE): 0.1 10*3/uL (ref 0.0–0.4)
Eos: 4 %
Hematocrit: 40 % (ref 37.5–51.0)
Hemoglobin: 12.6 g/dL — ABNORMAL LOW (ref 13.0–17.7)
Immature Grans (Abs): 0 10*3/uL (ref 0.0–0.1)
Immature Granulocytes: 0 %
Lymphocytes Absolute: 0.9 10*3/uL (ref 0.7–3.1)
Lymphs: 34 %
MCH: 26.4 pg — ABNORMAL LOW (ref 26.6–33.0)
MCHC: 31.5 g/dL (ref 31.5–35.7)
MCV: 84 fL (ref 79–97)
Monocytes Absolute: 0.4 10*3/uL (ref 0.1–0.9)
Monocytes: 14 %
Neutrophils Absolute: 1.2 10*3/uL — ABNORMAL LOW (ref 1.4–7.0)
Neutrophils: 48 %
Platelets: 182 10*3/uL (ref 150–450)
RBC: 4.78 x10E6/uL (ref 4.14–5.80)
RDW: 14 % (ref 11.6–15.4)
WBC: 2.5 10*3/uL — CL (ref 3.4–10.8)

## 2024-04-15 ENCOUNTER — Telehealth: Payer: Self-pay

## 2024-04-15 NOTE — Telephone Encounter (Signed)
 Copied from CRM [id number]. Topic: Clinical - Lab/Test Results >> Apr 14, 2024  5:12 PM Tiffany H wrote: Reason for CRM: Patient's partner called to review lab results. Voiced understanding. Will follow up with hematology.

## 2024-04-15 NOTE — Telephone Encounter (Signed)
 Noted

## 2024-04-19 ENCOUNTER — Other Ambulatory Visit: Payer: Self-pay | Admitting: Family Medicine

## 2024-04-19 DIAGNOSIS — E1169 Type 2 diabetes mellitus with other specified complication: Secondary | ICD-10-CM

## 2024-04-20 ENCOUNTER — Telehealth: Payer: Self-pay | Admitting: Pharmacist

## 2024-04-20 NOTE — Telephone Encounter (Signed)
 Spoke with patient and wife They do not need Humalog refill currently Patient has supply at home and Endocrinology is currently prescribing. Recent OV Endo notes say holding humalog Encouraged patient and wife to reach out to Endo for guidance.  Patient is stable at this time per spouse report.  He will get new insurance on 04/26/24.  They may require him to switch insulin brand names.   Patient to follow up with endo.  We are happy to assist as needed.  Wife verbalized understanding.  Parker Wherley Dattero Natanya Holecek, PharmD, BCACP, CPP Clinical Pharmacist, [facility]

## 2024-04-20 NOTE — Telephone Encounter (Signed)
 Can you look into this?  The pharmacy alternatives that are being given include the original order.  So I'm not sure what the issue is.

## 2024-04-20 NOTE — Telephone Encounter (Signed)
  ADMELOG SOLOSTAR 100 UNIT/ML KwikPen        Changed from: Insulin lispro (HUMALOG JUNIOR KWIKPEN) 100 UNIT/ML    Pharmacy comment: Alternative Requested:NAME BRAND AND GENERIC ARE NOT COVERED BY INSURANCE. SEND OVER AN ALTERNATIVE.   All Pharmacy Suggested Alternatives:  insulin lispro (ADMELOG SOLOSTAR) 100 UNIT/ML KwikPen insulin aspart (NOVOLOG FLEXPEN) 100 UNIT/ML FlexPen insulin aspart (FIASP FLEXTOUCH) 100 UNIT/ML FlexTouch Pen Insulin Aspart, w/Niacinamide, (FIASP) 100 UNIT/ML SOLN insulin aspart (NOVOLOG) 100 UNIT/ML injection

## 2024-05-17 ENCOUNTER — Telehealth: Payer: Self-pay | Admitting: Family Medicine

## 2024-05-17 ENCOUNTER — Ambulatory Visit

## 2024-05-17 DIAGNOSIS — E1169 Type 2 diabetes mellitus with other specified complication: Secondary | ICD-10-CM

## 2024-05-17 DIAGNOSIS — E1100 Type 2 diabetes mellitus with hyperosmolarity without nonketotic hyperglycemic-hyperosmolar coma (NKHHC): Secondary | ICD-10-CM

## 2024-05-17 MED ORDER — FREESTYLE LIBRE 3 READER DEVI: 1.0000 | Freq: Four times a day (QID) | 1 refills | Status: AC

## 2024-05-17 MED ORDER — FREESTYLE LIBRE 3 PLUS SENSOR MISC: 3 refills | Status: AC

## 2024-05-17 NOTE — Telephone Encounter (Signed)
 Wife said that pt's insurance doesn't pay for strips anymore. He needs the monitor that goes on the arm to test for diabetes. Pt uses [employer] [facility]

## 2024-05-17 NOTE — Telephone Encounter (Signed)
 Pt's Freestyle Libre reader is not working and needing refill on his sensors sent to [employer] [facility]. This has been done.

## 2024-05-18 ENCOUNTER — Ambulatory Visit

## 2024-05-18 DIAGNOSIS — E1169 Type 2 diabetes mellitus with other specified complication: Secondary | ICD-10-CM

## 2024-05-18 LAB — HM DIABETES EYE EXAM

## 2024-05-18 NOTE — Progress Notes (Signed)
 Arrived on 05/18/2024 and has given verbal consent to obtain images and complete their overdue diabetic retinal screening.  The images have been sent to an ophthalmologist or optometrist for review and interpretation.  Results will be sent back to [facility] for review.  Patient has been informed they will be contacted when we receive the results via telephone or MyChart.

## 2024-05-19 ENCOUNTER — Telehealth: Payer: Self-pay | Admitting: Pharmacist

## 2024-05-19 ENCOUNTER — Other Ambulatory Visit (HOSPITAL_COMMUNITY): Payer: Self-pay

## 2024-05-19 ENCOUNTER — Telehealth: Payer: Self-pay

## 2024-05-19 NOTE — Telephone Encounter (Signed)
 Pharmacy Patient Advocate Encounter   Received notification from CoverMyMeds that prior authorization for FreeStyle Libre 3 Reader device is required/requested.   Insurance verification completed.   The patient is insured through [facility] .   Per test claim: PA required; PA submitted to above mentioned insurance via CoverMyMeds Key/confirmation #/EOC [facility] Status is pending

## 2024-05-19 NOTE — Telephone Encounter (Signed)
 Detailed message left with patient re: broken Libre 3 CGM reader.  Patient can call [employer] [facility] at [phone] and request replacement.  I do not currently have samples.  Patient could also use phone (if compatible).  Encouraged patient to call back if he has questions.  Jaidan Prevette Dattero Daud Cayer, PharmD, BCACP, CPP Clinical Pharmacist, [facility]

## 2024-05-20 NOTE — Telephone Encounter (Signed)
 Pharmacy Patient Advocate Encounter  Received notification from [employer] that Prior Authorization for [facility] 3 Reader  has been CANCELLED due to    PA #/Case ID/Reference #: [id number]

## 2024-05-30 ENCOUNTER — Other Ambulatory Visit: Payer: Self-pay | Admitting: Family Medicine

## 2024-06-17 ENCOUNTER — Telehealth: Payer: Self-pay

## 2024-06-17 DIAGNOSIS — I739 Peripheral vascular disease, unspecified: Secondary | ICD-10-CM

## 2024-06-17 NOTE — Telephone Encounter (Signed)
 RESULTS FOR PCP

## 2024-06-17 NOTE — Telephone Encounter (Signed)
 Copied from CRM #[id number]. Topic: Clinical - Lab/Test Results >> Jun 17, 2024  2:38 PM Delon HERO wrote: Reason for CRM: Ronalee calling from [employer] to report the results of Quanta Flow PFD screen - 0.84 left foot 0.57 right foot.. Patients doing fine. Just calling with Results [phone]

## 2024-06-20 NOTE — Telephone Encounter (Signed)
 Please place a referral to vein and vascular of [place], diagnosis peripheral arterial disease, copy of the results from this telephone call into the referral of the Quanta flow PFD screen

## 2024-06-20 NOTE — Addendum Note (Signed)
 Addended by: LEIGH ROSINA SAILOR on: 06/20/2024 12:14 PM   Modules accepted: Orders

## 2024-06-20 NOTE — Telephone Encounter (Signed)
 Referral placed. Wife made aware. She has no concerns.

## 2024-06-22 ENCOUNTER — Ambulatory Visit: Admitting: Nurse Practitioner

## 2024-06-30 ENCOUNTER — Ambulatory Visit: Admitting: Nurse Practitioner

## 2024-07-18 ENCOUNTER — Encounter: Payer: Self-pay | Admitting: Family Medicine

## 2024-07-18 ENCOUNTER — Ambulatory Visit: Admitting: Family Medicine

## 2024-07-18 VITALS — BP 67/49 | HR 69 | Temp 97.3°F | Ht 65.0 in | Wt 165.0 lb

## 2024-07-18 DIAGNOSIS — Z23 Encounter for immunization: Secondary | ICD-10-CM

## 2024-07-18 DIAGNOSIS — E782 Mixed hyperlipidemia: Secondary | ICD-10-CM | POA: Diagnosis not present

## 2024-07-18 DIAGNOSIS — N400 Enlarged prostate without lower urinary tract symptoms: Secondary | ICD-10-CM

## 2024-07-18 DIAGNOSIS — E1169 Type 2 diabetes mellitus with other specified complication: Secondary | ICD-10-CM | POA: Diagnosis not present

## 2024-07-18 LAB — CBC WITH DIFFERENTIAL/PLATELET
Basophils Absolute: 0 x10E3/uL (ref 0.0–0.2)
Basos: 1 %
EOS (ABSOLUTE): 0.1 x10E3/uL (ref 0.0–0.4)
Eos: 4 %
Hematocrit: 40.6 % (ref 37.5–51.0)
Hemoglobin: 13 g/dL (ref 13.0–17.7)
Immature Grans (Abs): 0 x10E3/uL (ref 0.0–0.1)
Immature Granulocytes: 0 %
Lymphocytes Absolute: 0.9 x10E3/uL (ref 0.7–3.1)
Lymphs: 38 %
MCH: 27 pg (ref 26.6–33.0)
MCHC: 32 g/dL (ref 31.5–35.7)
MCV: 84 fL (ref 79–97)
Monocytes Absolute: 0.3 x10E3/uL (ref 0.1–0.9)
Monocytes: 11 %
Neutrophils Absolute: 1.1 x10E3/uL — ABNORMAL LOW (ref 1.4–7.0)
Neutrophils: 46 %
Platelets: 150 x10E3/uL (ref 150–450)
RBC: 4.81 x10E6/uL (ref 4.14–5.80)
RDW: 14.5 % (ref 11.6–15.4)
WBC: 2.4 x10E3/uL — CL (ref 3.4–10.8)

## 2024-07-18 NOTE — Progress Notes (Addendum)
 BP (!) 67/49   Pulse 69   Temp (!) 97.3 F (36.3 C)   Ht 5' 5 (1.651 m)   Wt 165 lb (74.8 kg)   SpO2 96%   BMI 27.46 kg/m    Subjective:   Patient ID: Colin Mitchell, male    DOB: 1950-12-17, 73 y.o.   MRN: [id number]  HPI: [name] is a 73 y.o. male presenting on 07/18/2024 for 3 MONTH FOLLOW UP   Discussed the use of AI scribe software for clinical note transcription with the patient, who gave verbal consent to proceed.  [name]   Colin Mitchell is a 73 year old male who presents with concerns about low blood pressure and blood sugar levels.  He has been experiencing low blood pressure with recent readings around 62 mmHg. He is unsure of the cause as he maintains his usual routine. He consumes coffee and orange juice in the morning.  He reports that he is not currently taking insulin or other diabetes medications. His blood sugar was very low one morning but has been stable at 62 mg/dL for the past three to four days. He eats three meals a day and is concerned about the accuracy of his glucose monitor. He has a new monitor and is advised to check with a finger stick if he doubts its accuracy. No symptoms associated with low blood pressure or low blood sugar.  He has a history of poor circulation in his legs. His legs were swollen the previous day but improved after elevation. He mentions sore muscles as a symptom he feels bad about. No smoking.      Relevant past medical, surgical, family and social history reviewed and updated as indicated. Interim medical history since our last visit reviewed. Allergies and medications reviewed and updated.  Review of Systems  Constitutional:  Negative for chills and fever.  Eyes:  Negative for visual disturbance.  Respiratory:  Negative for shortness of breath and wheezing.   Cardiovascular:  Negative for chest pain, palpitations and leg swelling.  Musculoskeletal:  Negative for back pain and gait problem.   Skin:  Negative for rash.  Neurological:  Negative for dizziness, syncope and light-headedness.  All other systems reviewed and are negative.   Per HPI unless specifically indicated above   Allergies as of 07/18/2024       Reactions   Sulfa Antibiotics    Aspirin Rash   Ibuprofen Rash   Penicillins Rash        Medication List        Accurate as of July 18, 2024 10:41 AM. If you have any questions, ask your nurse or doctor.          Accu-Chek Guide w/Device Kit CHECK BLOOD SUGAR AS DIRECTED   Blood Glucose Monitoring Suppl Devi 1 each by Does not apply route in the morning, at noon, and at bedtime. May substitute to any manufacturer covered by patient's insurance.   Accu-Chek Softclix Lancets lancets 1 each by Other route 3 (three) times daily. E11.9- DM   albuterol  108 (90 Base) MCG/ACT inhaler Commonly known as: VENTOLIN  HFA INHALE 2 PUFFS EVERY 6 HOURS AS NEEDED FOR WHEEZING OR SHORTNESS OF BREATH   azelastine  0.1 % nasal spray Commonly known as: ASTELIN  Place 1 spray into both nostrils 2 (two) times daily. Use in each nostril as directed   clopidogrel  75 MG tablet Commonly known as: PLAVIX  Take 1 tablet (75 mg total) by mouth daily.  ferrous sulfate  325 (65 FE) MG EC tablet Take 1 tablet (325 mg total) by mouth daily with breakfast.   finasteride  5 MG tablet Commonly known as: Proscar  Take 1 tablet (5 mg total) by mouth daily.   fluticasone  50 MCG/ACT nasal spray Commonly known as: FLONASE  Place 1 spray into both nostrils daily.   FreeStyle Libre 3 Plus Sensor Misc Change sensor every 15 days.   FreeStyle Libre 3 Reader Devi 1 each by Does not apply route 4 (four) times daily.   FREESTYLE LITE test strip Generic drug: glucose blood Use as instructed to monitor glucose 4 times daily.  Use as back up to CGM   gabapentin  100 MG capsule Commonly known as: NEURONTIN  Take 1 capsule (100 mg total) by mouth at bedtime.   GNP UltiCare Pen  Needles 32G X 4 MM Misc Generic drug: Insulin Pen Needle UAD up to four times daily Dx E11.9   insulin lispro  100 UNIT/ML KwikPen Commonly known as: Admelog SoloStar Inject 5 Units into the skin 3 (three) times daily. PRN for For blood sugar greater than 180 give 5 units   pravastatin  40 MG tablet Commonly known as: PRAVACHOL  Take 1 tablet (40 mg total) by mouth daily. What changed: when to take this   tamsulosin  0.4 MG Caps capsule Commonly known as: FLOMAX  Take 1 capsule (0.4 mg total) by mouth 2 (two) times daily.   vitamin C  100 MG tablet Take 1 tablet (100 mg total) by mouth daily.         Objective:   BP (!) 67/49   Pulse 69   Temp (!) 97.3 F (36.3 C)   Ht 5' 5 (1.651 m)   Wt 165 lb (74.8 kg)   SpO2 96%   BMI 27.46 kg/m   Wt Readings from Last 3 Encounters:  07/18/24 165 lb (74.8 kg)  04/13/24 167 lb (75.8 kg)  02/22/24 166 lb 4.8 oz (75.4 kg)    Physical Exam Vitals and nursing note reviewed.  Constitutional:      General: He is not in acute distress.    Appearance: He is well-developed. He is not diaphoretic.  Eyes:     General: No scleral icterus.    Conjunctiva/sclera: Conjunctivae normal.  Neck:     Thyroid : No thyromegaly.  Cardiovascular:     Rate and Rhythm: Normal rate and regular rhythm.     Heart sounds: Normal heart sounds. No murmur heard. Pulmonary:     Effort: Pulmonary effort is normal. No respiratory distress.     Breath sounds: Normal breath sounds. No wheezing.  Musculoskeletal:        General: Normal range of motion.     Cervical back: Neck supple.  Lymphadenopathy:     Cervical: No cervical adenopathy.  Skin:    General: Skin is warm and dry.     Findings: No rash.  Neurological:     Mental Status: He is alert and oriented to person, place, and time.     Coordination: Coordination normal.  Psychiatric:        Behavior: Behavior normal.    Physical Exam   VITALS: BP- 120/82 CHEST: Lungs clear to auscultation  bilaterally. CARDIOVASCULAR: Heart regular rate and rhythm. EXTREMITIES: No edema in legs, pulses palpable.       Results for orders placed or performed in visit on 05/20/24  [facility] DIABETES EYE EXAM   Collection Time: 05/18/24  3:03 PM  Result Value Ref Range   HM Diabetic Eye Exam  Assessment & Plan:   Problem List Items Addressed This Visit       Endocrine   Diabetes mellitus (HCC) - Primary   Relevant Orders   Bayer DCA Hb A1c Waived     Genitourinary   BPH (benign prostatic hyperplasia)     Other   Hyperlipemia   Relevant Orders   CBC with Differential/Platelet   Other Visit Diagnoses       Encounter for immunization       Relevant Orders   Flu vaccine HIGH DOSE PF(Fluzone Trivalent) (Completed)          Hypotension Hypotension with low blood pressure readings. No medications contributing. Coffee consumption noted as a potential factor. No acute symptoms. Hydration and dietary factors discussed. - Advise to reduce caffeinated drinks. - Encourage increased water intake. - Monitor blood pressure at home for two weeks and report readings.  Peripheral arterial disease of lower extremities Moderate peripheral arterial disease with diminished circulation. Not severe enough for surgical intervention. Negative smoking status. Hydration discussed as beneficial. - Advise to stay hydrated. - Monitor for any wounds or sores on the legs and report immediately.  Type 2 diabetes mellitus without current complications Type 2 diabetes mellitus not treated with medication. Blood sugar levels low but asymptomatic. Exercise and diet discussed. Advised to ensure glucometer accuracy. - Ensure consumption of three meals a day. - Monitor blood sugar levels and ensure glucometer is functioning properly.   Blood pressure is on the lower side, encourage hydration and encourage increased appetite. Instructed to monitor closely over the next 2 weeks at home.  Patient is asymptomatic  with no signs or symptoms of infection     Follow up plan: Return in about 3 months (around 10/17/2024), or if symptoms worsen or fail to improve, for Diabetes recheck.  Counseling provided for all of the vaccine components Orders Placed This Encounter  Procedures   Flu vaccine HIGH DOSE PF(Fluzone Trivalent)   Bayer DCA Hb A1c Waived   CBC with Differential/Platelet    Fonda Levins, MD [facility] 07/18/2024, 10:41 AM

## 2024-07-20 ENCOUNTER — Ambulatory Visit: Payer: Self-pay | Admitting: Family Medicine

## 2024-08-01 ENCOUNTER — Ambulatory Visit: Admitting: Nurse Practitioner

## 2024-08-01 ENCOUNTER — Encounter: Payer: Self-pay | Admitting: Nurse Practitioner

## 2024-08-01 VITALS — BP 94/62 | HR 75 | Ht 65.0 in

## 2024-08-01 DIAGNOSIS — E119 Type 2 diabetes mellitus without complications: Secondary | ICD-10-CM | POA: Diagnosis not present

## 2024-08-01 DIAGNOSIS — E782 Mixed hyperlipidemia: Secondary | ICD-10-CM

## 2024-08-01 LAB — POCT GLYCOSYLATED HEMOGLOBIN (HGB A1C): Hemoglobin A1C: 6 % — AB (ref 4.0–5.6)

## 2024-08-01 NOTE — Progress Notes (Signed)
 Endocrinology Follow Up Note       08/01/2024, 2:26 PM   Subjective:    Patient ID: Colin Mitchell, male    DOB: November 05, 1950.  Colin Mitchell is being seen in follow up after being seen in consultation for management of currently uncontrolled symptomatic diabetes requested by  Dettinger, Fonda LABOR, MD.   Past Medical History:  Diagnosis Date   Allergic rhinitis    Benign prostatic hypertrophy    CAD (coronary artery disease)    Cancer (HCC) 2006, 2011   SCCA of the [place]; S/P radiation therapy 06/09/05 thru 07/31/05; H/O recurrence in 2011 and S/P resection on 08/22/10.   Degenerative disc disease    Depression    H/O depression   Detached retina, left    H/O Detached retina of left eye with visual deficit   HLD (hyperlipidemia)    Seizures (HCC)    H/O seizures secondary to alcohol withdrawal   Sickle cell anemia (HCC)    only in his eyes   Throat cancer (HCC)    Thrombocytopenia    H/O thrombocytopenia    Past Surgical History:  Procedure Laterality Date   PILONIDAL CYST EXCISION  remote   THROAT SURGERY  2015    Social History   Socioeconomic History   Marital status: Divorced    Spouse name: Not on file   Number of children: 1   Years of education: Not on file   Highest education level: GED or equivalent  Occupational History   Occupation: retired  Tobacco Use   Smoking status: Former    Current packs/day: 0.00    Average packs/day: 1 pack/day for 60.0 years (60.0 ttl pk-yrs)    Types: Cigarettes    Quit date: 01/02/2023    Years since quitting: 1.5   Smokeless tobacco: Never  Vaping Use   Vaping status: Never Used  Substance and Sexual Activity   Alcohol use: Not Currently    Alcohol/week: 4.0 standard drinks of alcohol    Types: 4 Cans of beer per week    Comment: 4 cans per week   Drug use: Never   Sexual activity: Not Currently  Other Topics Concern   Not on file  Social  History Narrative   Not on file   Social Drivers of Health   Financial Resource Strain: Low Risk  (12/23/2023)   Overall Financial Resource Strain (CARDIA)    Difficulty of Paying Living Expenses: Not hard at all  Food Insecurity: Food Insecurity Present (02/22/2024)   Hunger Vital Sign    Worried About Running Out of Food in the Last Year: Sometimes true    Ran Out of Food in the Last Year: Sometimes true  Transportation Needs: No Transportation Needs (02/22/2024)   PRAPARE - Administrator, Civil Service (Medical): No    Lack of Transportation (Non-Medical): No  Physical Activity: Insufficiently Active (12/23/2023)   Exercise Vital Sign    Days of Exercise per Week: 3 days    Minutes of Exercise per Session: 30 min  Stress: No Stress Concern Present (12/23/2023)   [employer] of Occupational Health - Occupational Stress Questionnaire    Feeling of Stress :  Not at all  Social Connections: Socially Isolated (12/23/2023)   Social Connection and Isolation Panel    Frequency of Communication with Friends and Family: More than three times a week    Frequency of Social Gatherings with Friends and Family: Three times a week    Attends Religious Services: Never    Active Member of Clubs or Organizations: No    Attends Banker Meetings: Never    Marital Status: Divorced    Family History  Problem Relation Age of Onset   Cancer Mother        breast    Diabetes Mother    Hypertension Mother    Cancer Father    Cancer Brother        kidney    Outpatient Encounter Medications as of 08/01/2024  Medication Sig   Accu-Chek Softclix Lancets lancets 1 each by Other route 3 (three) times daily. E11.9- DM   Ascorbic Acid (VITAMIN C ) 100 MG tablet Take 1 tablet (100 mg total) by mouth daily.   Blood Glucose Monitoring Suppl (ACCU-CHEK GUIDE) w/Device KIT CHECK BLOOD SUGAR AS DIRECTED   Blood Glucose Monitoring Suppl DEVI 1 each by Does not apply route in the  morning, at noon, and at bedtime. May substitute to any manufacturer covered by patient's insurance.   clopidogrel  (PLAVIX ) 75 MG tablet Take 1 tablet (75 mg total) by mouth daily.   ferrous sulfate  325 (65 FE) MG EC tablet Take 1 tablet (325 mg total) by mouth daily with breakfast.   finasteride  (PROSCAR ) 5 MG tablet Take 1 tablet (5 mg total) by mouth daily.   fluticasone  (FLONASE ) 50 MCG/ACT nasal spray Place 1 spray into both nostrils daily.   gabapentin  (NEURONTIN ) 100 MG capsule Take 1 capsule (100 mg total) by mouth at bedtime.   glucose blood (FREESTYLE LITE) test strip Use as instructed to monitor glucose 4 times daily.  Use as back up to CGM   Insulin Pen Needle (GNP ULTICARE PEN NEEDLES) 32G X 4 MM MISC UAD up to four times daily Dx E11.9   pravastatin  (PRAVACHOL ) 40 MG tablet Take 1 tablet (40 mg total) by mouth daily. (Patient taking differently: Take 40 mg by mouth 3 (three) times daily.)   tamsulosin  (FLOMAX ) 0.4 MG CAPS capsule Take 1 capsule (0.4 mg total) by mouth 2 (two) times daily.   albuterol  (VENTOLIN  HFA) 108 (90 Base) MCG/ACT inhaler INHALE 2 PUFFS EVERY 6 HOURS AS NEEDED FOR WHEEZING OR SHORTNESS OF BREATH (Patient not taking: Reported on 08/01/2024)   azelastine  (ASTELIN ) 0.1 % nasal spray Place 1 spray into both nostrils 2 (two) times daily. Use in each nostril as directed   Continuous Glucose Receiver (FREESTYLE LIBRE 3 READER) DEVI 1 each by Does not apply route 4 (four) times daily. (Patient not taking: Reported on 08/01/2024)   Continuous Glucose Sensor (FREESTYLE LIBRE 3 PLUS SENSOR) MISC Change sensor every 15 days. (Patient not taking: Reported on 08/01/2024)   insulin lispro  (ADMELOG SOLOSTAR) 100 UNIT/ML KwikPen Inject 5 Units into the skin 3 (three) times daily. PRN for For blood sugar greater than 180 give 5 units (Patient not taking: Reported on 08/01/2024)   No facility-administered encounter medications on file as of 08/01/2024.    ALLERGIES: Allergies   Allergen Reactions   Sulfa Antibiotics    Aspirin Rash   Ibuprofen Rash   Penicillins Rash    VACCINATION STATUS: Immunization History  Administered Date(s) Administered   Fluad Quad(high Dose 65+) 08/21/2022   Fluad  Trivalent(High Dose 65+) 09/08/2023   INFLUENZA, HIGH DOSE SEASONAL PF 07/18/2024   Moderna Covid-19 Fall Seasonal Vaccine 65yrs & older 09/08/2023   Moderna Sars-Covid-2 Vaccination 08/22/2020, 12/17/2021   PFIZER(Purple Top)SARS-COV-2 Vaccination 12/22/2019, 01/20/2020   PNEUMOCOCCAL CONJUGATE-20 04/13/2024   Pfizer(Comirnaty)Fall Seasonal Vaccine 12 years and older 08/05/2022   Pneumococcal Conjugate-13 03/15/2018   Unspecified SARS-COV-2 Vaccination 08/05/2022   Zoster Recombinant(Shingrix ) 12/29/2022    Diabetes He presents for his follow-up diabetic visit. He has type 2 diabetes mellitus. Onset time: Diagnosed at age 46. His disease course has been stable. There are no hypoglycemic associated symptoms. There are no diabetic associated symptoms. There are no hypoglycemic complications. Diabetic complications include nephropathy. Risk factors for coronary artery disease include diabetes mellitus, dyslipidemia, male sex and hypertension. Current diabetic treatment includes diet and insulin injections. His weight is fluctuating minimally. He is following a generally healthy diet. When asked about meal planning, he reported none. He has not had a previous visit with a dietitian. He participates in exercise intermittently. (He presents today, accompanied by his wife, with no meter or logs to review.  He was not asked to routinely monitor glucose as he was taken off medications last visit.  His POCT A1c today is 6%, unchanged from previous visit.  He continues to eat right and generally feels well overall.) An ACE inhibitor/angiotensin II receptor blocker is not being taken. He does not see a podiatrist.Eye exam is current.     Review of systems  Constitutional: +  Minimally fluctuating body weight, current Body mass index is 27.46 kg/m., no fatigue, no subjective hyperthermia, no subjective hypothermia Eyes: no blurry vision, no xerophthalmia ENT: no sore throat, no nodules palpated in throat, no dysphagia/odynophagia, no hoarseness Cardiovascular: no chest pain, no shortness of breath, no palpitations, no leg swelling Respiratory: no cough, no shortness of breath Gastrointestinal: no nausea/vomiting/diarrhea Musculoskeletal: no muscle/joint aches Skin: no rashes, no hyperemia Neurological: no tremors, no numbness, no tingling, no dizziness Psychiatric: no depression, no anxiety  Objective:     BP 94/62 (BP Location: Left Arm, Patient Position: Sitting, Cuff Size: Large)   Pulse 75   Ht 5' 5 (1.651 m)   BMI 27.46 kg/m   Wt Readings from Last 3 Encounters:  07/18/24 165 lb (74.8 kg)  04/13/24 167 lb (75.8 kg)  02/22/24 166 lb 4.8 oz (75.4 kg)     BP Readings from Last 3 Encounters:  08/01/24 94/62  07/18/24 (!) 67/49  04/13/24 137/75      Physical Exam- Limited  Constitutional:  Body mass index is 27.46 kg/m. , not in acute distress, normal state of mind Eyes:  EOMI, no exophthalmos Musculoskeletal: no gross deformities, strength intact in all four extremities, no gross restriction of joint movements Skin:  no rashes, no hyperemia Neurological: no tremor with outstretched hands   Diabetic Foot Exam - Simple   No data filed      CMP ( most recent) CMP     Component Value Date/Time   NA 138 04/13/2024 1016   K 4.5 04/13/2024 1016   CL 101 04/13/2024 1016   CO2 21 04/13/2024 1016   GLUCOSE 83 04/13/2024 1016   GLUCOSE 105 (H) 02/22/2024 0945   BUN 13 04/13/2024 1016   CREATININE 1.01 04/13/2024 1016   CREATININE 1.06 02/22/2024 0945   CALCIUM 9.5 04/13/2024 1016   PROT 7.2 04/13/2024 1016   ALBUMIN 4.4 04/13/2024 1016   AST 13 04/13/2024 1016   AST 20 02/22/2024 0945   ALT  8 04/13/2024 1016   ALT 10 02/22/2024  0945   ALKPHOS 47 04/13/2024 1016   BILITOT 0.3 04/13/2024 1016   BILITOT 0.4 02/22/2024 0945   EGFR 79 04/13/2024 1016   GFRNONAA >60 02/22/2024 0945     Diabetic Labs (most recent): Lab Results  Component Value Date   HGBA1C 6.0 (A) 08/01/2024   HGBA1C 6.0 (H) 04/13/2024   HGBA1C 6.2 (H) 01/11/2024     Lipid Panel ( most recent) Lipid Panel     Component Value Date/Time   CHOL 169 01/11/2024 0806   TRIG 75 01/11/2024 0806   HDL 45 01/11/2024 0806   CHOLHDL 3.8 01/11/2024 0806   LDLCALC 110 (H) 01/11/2024 0806   LABVLDL 14 01/11/2024 0806      Lab Results  Component Value Date   TSH 2.730 02/22/2024   TSH 2.360 08/21/2022   TSH 2.970 01/17/2021   TSH 3.210 02/15/2020   TSH 2.910 08/24/2019           Assessment & Plan:   1) Type 2 diabetes mellitus without complication, without long-term current use of insulin (HCC) (Primary)  He presents today, accompanied by his wife, with no meter or logs to review.  He was not asked to routinely monitor glucose as he was taken off medications last visit.  His POCT A1c today is 6%, unchanged from previous visit.  He continues to eat right and generally feels well overall.  - Colin CROME Flegal has currently uncontrolled symptomatic type 2 DM since 73 years of age.   -Recent labs reviewed.  - I had a long discussion with him about the progressive nature of diabetes and the pathology behind its complications. -his diabetes is complicated by mild CKD and he remains at a high risk for more acute and chronic complications which include CAD, CVA, CKD, retinopathy, and neuropathy. These are all discussed in detail with him.  The following Lifestyle Medicine recommendations according to American College of Lifestyle Medicine [facility]) were discussed and offered to patient and he agrees to start the journey:  A. Whole Foods, Plant-based plate comprising of fruits and vegetables, plant-based proteins, whole-grain carbohydrates was  discussed in detail with the patient.   A list for source of those nutrients were also provided to the patient.  Patient will use only water or unsweetened tea for hydration. B.  The need to stay away from risky substances including alcohol, smoking; obtaining 7 to 9 hours of restorative sleep, at least 150 minutes of moderate intensity exercise weekly, the importance of healthy social connections,  and stress reduction techniques were discussed. C.  A full color page of  Calorie density of various food groups per pound showing examples of each food groups was provided to the patient.  - Nutritional counseling repeated at each appointment due to patients tendency to fall back in to old habits.  - The patient admits there is a room for improvement in their diet and drink choices. -  Suggestion is made for the patient to avoid simple carbohydrates from their diet including Cakes, Sweet Desserts / Pastries, Ice Cream, Soda (diet and regular), Sweet Tea, Candies, Chips, Cookies, Sweet Pastries, Store Bought Juices, Alcohol in Excess of 1-2 drinks a day, Artificial Sweeteners, Coffee Creamer, and Sugar-free Products. This will help patient to have stable blood glucose profile and potentially avoid unintended weight gain.   - I encouraged the patient to switch to unprocessed or minimally processed complex starch and increased protein intake (animal or plant source),  fruits, and vegetables.   - Patient is advised to stick to a routine mealtimes to eat 3 meals a day and avoid unnecessary snacks (to snack only to correct hypoglycemia).  - I have approached him with the following individualized plan to manage his diabetes and patient agrees:   -Given his stable glycemic profile, he does not need re-initiation of diabetes medications, nor does he need to monitor glucose routinely.  -If he can keep A1c under 6.5 for over a year-without medication, we can say his diabetes is in remission.  - he is not an  ideal candidate for incretin therapy given body habitus.  - Specific targets for  A1c; LDL, HDL, and Triglycerides were discussed with the patient.  2) Blood Pressure /Hypertension:  his blood pressure is controlled to target.   he is advised to continue his current medications as prescribed by his PCP.  3) Lipids/Hyperlipidemia:    Review of his recent lipid panel from 01/11/24 showed uncontrolled LDL at 110 .  he is advised to continue Pravastatin  40 mg daily at bedtime.  Side effects and precautions discussed with him.  4)  Weight/Diet:  his Body mass index is 27.46 kg/m.  -   he is NOT a candidate for weight loss.  Exercise, and detailed carbohydrates information provided  -  detailed on discharge instructions.  5) Chronic Care/Health Maintenance: -he is not on ACEI/ARB and is on Statin medications and is encouraged to initiate and continue to follow up with Ophthalmology, Dentist, Podiatrist at least yearly or according to recommendations, and advised to stay away from smoking. I have recommended yearly flu vaccine and pneumonia vaccine at least every 5 years; moderate intensity exercise for up to 150 minutes weekly; and sleep for at least 7 hours a day.  - he is advised to maintain close follow up with Dettinger, Fonda LABOR, MD for primary care needs, as well as his other providers for optimal and coordinated care.     I spent  42  minutes in the care of the patient today including review of labs from CMP, Lipids, Thyroid  Function, Hematology (current and previous including abstractions from other facilities); face-to-face time discussing  his blood glucose readings/logs, discussing hypoglycemia and hyperglycemia episodes and symptoms, medications doses, his options of short and long term treatment based on the latest standards of care / guidelines;  discussion about incorporating lifestyle medicine;  and documenting the encounter. Risk reduction counseling performed per USPSTF guidelines to  reduce obesity and cardiovascular risk factors.     Please refer to Patient Instructions for Blood Glucose Monitoring and Insulin/Medications Dosing Guide  in media tab for additional information. Please  also refer to  Patient Self Inventory in the Media  tab for reviewed elements of pertinent patient history.  Colin Mitchell participated in the discussions, expressed understanding, and voiced agreement with the above plans.  All questions were answered to his satisfaction. he is encouraged to contact clinic should he have any questions or concerns prior to his return visit.     Follow up plan: - Return in about 6 months (around 01/30/2025) for Diabetes F/U with A1c in office, No previsit labs.   Benton Rio, [facility] [address] [place], [place] [address] Phone: [phone] Fax: [phone]  08/01/2024, 2:26 PM

## 2024-08-12 ENCOUNTER — Other Ambulatory Visit: Payer: Self-pay

## 2024-08-12 DIAGNOSIS — I739 Peripheral vascular disease, unspecified: Secondary | ICD-10-CM

## 2024-08-23 ENCOUNTER — Inpatient Hospital Stay

## 2024-08-23 ENCOUNTER — Inpatient Hospital Stay: Attending: Oncology | Admitting: Oncology

## 2024-08-23 ENCOUNTER — Encounter: Payer: Self-pay | Admitting: Oncology

## 2024-08-23 VITALS — BP 84/60 | Temp 98.2°F | Ht 65.0 in | Wt 161.7 lb

## 2024-08-23 DIAGNOSIS — Z85819 Personal history of malignant neoplasm of unspecified site of lip, oral cavity, and pharynx: Secondary | ICD-10-CM | POA: Diagnosis not present

## 2024-08-23 DIAGNOSIS — D72819 Decreased white blood cell count, unspecified: Secondary | ICD-10-CM | POA: Insufficient documentation

## 2024-08-23 DIAGNOSIS — E611 Iron deficiency: Secondary | ICD-10-CM | POA: Insufficient documentation

## 2024-08-23 DIAGNOSIS — D573 Sickle-cell trait: Secondary | ICD-10-CM

## 2024-08-23 DIAGNOSIS — Z85818 Personal history of malignant neoplasm of other sites of lip, oral cavity, and pharynx: Secondary | ICD-10-CM | POA: Insufficient documentation

## 2024-08-23 LAB — FOLATE: Folate: 18.8 ng/mL (ref >5.9)

## 2024-08-23 LAB — CBC WITH DIFFERENTIAL (CANCER CENTER ONLY)
Abs Immature Granulocytes: 0 K/uL (ref 0.00–0.07)
Basophils Absolute: 0 K/uL (ref 0.0–0.1)
Basophils Relative: 1 %
Eosinophils Absolute: 0.1 K/uL (ref 0.0–0.5)
Eosinophils Relative: 4 %
HCT: 41.7 % (ref 39.0–52.0)
Hemoglobin: 13.6 g/dL (ref 13.0–17.0)
Immature Granulocytes: 0 %
Lymphocytes Relative: 28 %
Lymphs Abs: 0.7 K/uL (ref 0.7–4.0)
MCH: 27.3 pg (ref 26.0–34.0)
MCHC: 32.6 g/dL (ref 30.0–36.0)
MCV: 83.6 fL (ref 80.0–100.0)
Monocytes Absolute: 0.2 K/uL (ref 0.1–1.0)
Monocytes Relative: 8 %
Neutro Abs: 1.5 K/uL — ABNORMAL LOW (ref 1.7–7.7)
Neutrophils Relative %: 59 %
Platelet Count: 157 K/uL (ref 150–400)
RBC: 4.99 MIL/uL (ref 4.22–5.81)
RDW: 15.2 % (ref 11.5–15.5)
WBC Count: 2.5 K/uL — ABNORMAL LOW (ref 4.0–10.5)
nRBC: 0 % (ref 0.0–0.2)

## 2024-08-23 LAB — VITAMIN B12: Vitamin B-12: 506 pg/mL (ref 180–914)

## 2024-08-23 LAB — CMP (CANCER CENTER ONLY)
ALT: 8 U/L (ref 0–44)
AST: 17 U/L (ref 15–41)
Albumin: 4.3 g/dL (ref 3.5–5.0)
Alkaline Phosphatase: 49 U/L (ref 38–126)
Anion gap: 9 (ref 5–15)
BUN: 14 mg/dL (ref 8–23)
CO2: 27 mmol/L (ref 22–32)
Calcium: 10 mg/dL (ref 8.9–10.3)
Chloride: 107 mmol/L (ref 98–111)
Creatinine: 1.08 mg/dL (ref 0.61–1.24)
GFR, Estimated: >60 mL/min (ref >60)
Glucose, Bld: 93 mg/dL (ref 70–99)
Potassium: 4.4 mmol/L (ref 3.5–5.1)
Sodium: 143 mmol/L (ref 135–145)
Total Bilirubin: 0.4 mg/dL (ref 0.0–1.2)
Total Protein: 7.7 g/dL (ref 6.5–8.1)

## 2024-08-23 LAB — IRON AND TIBC
Iron: 73 ug/dL (ref 45–182)
Saturation Ratios: 20 % (ref 17.9–39.5)
TIBC: 367 ug/dL (ref 250–450)
UIBC: 294 ug/dL

## 2024-08-23 LAB — FERRITIN: Ferritin: 30 ng/mL (ref 24–336)

## 2024-08-23 MED ORDER — FERROUS SULFATE 325 (65 FE) MG PO TBEC: 325.0000 mg | DELAYED_RELEASE_TABLET | Freq: Every day | ORAL | 3 refills | Status: AC

## 2024-08-23 NOTE — Assessment & Plan Note (Signed)
 Confirmed sickle cell trait with 40% hemoglobin as sickle cell type. No evidence of sickle cell disease. Previous reports indicated sickle cell trait noted in the eyes. No current treatment required.
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
## 2024-08-23 NOTE — Assessment & Plan Note (Addendum)
 Chronic leukopenia with white blood cell counts ranging from 2,300 to 3,700 over the past 14 years.   On his initial consultation with us  on 02/22/2024, labs stable white count of 2500 with ANC of 1500, normal differential.  No immature cells noted in the periphery.  Hemoglobin 13.4, platelet count 121,000.  CMP, LDH, vitamin B12, folic acid , TSH, reticulocyte count were all within normal limits. Iron studies showed evidence of iron deficiency with iron saturation of 13%, ferritin of 19, slightly increased iron binding capacity of 452.  Hepatitis panel, HIV, rheumatoid factor, ANA were negative.  Hemoglobin electrophoresis showed evidence of sickle cell trait with 39.2% Hgb S and 58.2% Hgb A, Hgb A2 of 2.6%.   Labs show stable white count of 2400 with ANC of 1500 today.  Hemoglobin completed [place] within normal limits.  CMP unremarkable.  Iron studies, B12, folate are within normal limits today.  Patient was previously started on oral iron supplements with ferrous sulfate  325 mg daily.  However he did not pick up the prescription.  Prescription sent again today.  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 No evidence of recurrent infections, systemic symptoms, fevers, chills, or night sweats. Differential includes benign ethnic neutropenia and other causes of leukopenia, but bone marrow pathology is unlikely.   - Plan to see him again in six months with repeat labs.

## 2024-08-23 NOTE — Assessment & Plan Note (Signed)
 History of squamous cell carcinoma of the uvula diagnosed in 2006, treated initially with radiation, with recurrence in 2011, s/p resection on 08/22/2010.  Remains in remission clinically.

## 2024-09-07 ENCOUNTER — Ambulatory Visit (HOSPITAL_COMMUNITY)

## 2024-09-07 ENCOUNTER — Encounter: Admitting: Vascular Surgery

## 2024-10-24 ENCOUNTER — Ambulatory Visit: Payer: Self-pay | Admitting: Family Medicine

## 2024-10-28 ENCOUNTER — Encounter: Payer: Self-pay | Admitting: Family Medicine

## 2024-11-02 ENCOUNTER — Other Ambulatory Visit: Payer: Self-pay | Admitting: Family Medicine

## 2024-11-02 DIAGNOSIS — E782 Mixed hyperlipidemia: Secondary | ICD-10-CM

## 2024-12-16 ENCOUNTER — Ambulatory Visit: Admitting: Family Medicine

## 2024-12-23 ENCOUNTER — Ambulatory Visit: Payer: Self-pay

## 2024-12-27 ENCOUNTER — Ambulatory Visit

## 2025-01-30 ENCOUNTER — Ambulatory Visit: Admitting: Nurse Practitioner

## 2025-02-21 ENCOUNTER — Inpatient Hospital Stay

## 2025-02-21 ENCOUNTER — Inpatient Hospital Stay: Admitting: Oncology
